# Patient Record
Sex: Female | Born: 1985 | Race: Black or African American | Hispanic: No | Marital: Single | Smoking: Former smoker
Health system: Southern US, Community
[De-identification: ages and names within clinical notes are randomized; demographics above are authoritative.]

## PROBLEM LIST (undated history)

## (undated) DIAGNOSIS — B009 Herpesviral infection, unspecified: Secondary | ICD-10-CM

## (undated) DIAGNOSIS — K219 Gastro-esophageal reflux disease without esophagitis: Secondary | ICD-10-CM

## (undated) DIAGNOSIS — N760 Acute vaginitis: Secondary | ICD-10-CM

## (undated) DIAGNOSIS — A599 Trichomoniasis, unspecified: Secondary | ICD-10-CM

## (undated) DIAGNOSIS — B9689 Other specified bacterial agents as the cause of diseases classified elsewhere: Secondary | ICD-10-CM

## (undated) DIAGNOSIS — A6000 Herpesviral infection of urogenital system, unspecified: Secondary | ICD-10-CM

## (undated) DIAGNOSIS — E785 Hyperlipidemia, unspecified: Secondary | ICD-10-CM

## (undated) DIAGNOSIS — A549 Gonococcal infection, unspecified: Secondary | ICD-10-CM

## (undated) DIAGNOSIS — D649 Anemia, unspecified: Secondary | ICD-10-CM

## (undated) DIAGNOSIS — E669 Obesity, unspecified: Secondary | ICD-10-CM

## (undated) DIAGNOSIS — K649 Unspecified hemorrhoids: Secondary | ICD-10-CM

## (undated) HISTORY — DX: Unspecified hemorrhoids: K64.9

## (undated) HISTORY — DX: Gonococcal infection, unspecified: A54.9

## (undated) HISTORY — DX: Obesity, unspecified: E66.9

## (undated) HISTORY — DX: Trichomoniasis, unspecified: A59.9

## (undated) HISTORY — DX: Other specified bacterial agents as the cause of diseases classified elsewhere: B96.89

## (undated) HISTORY — DX: Herpesviral infection of urogenital system, unspecified: A60.00

## (undated) HISTORY — DX: Hyperlipidemia, unspecified: E78.5

## (undated) HISTORY — DX: Herpesviral infection, unspecified: B00.9

## (undated) HISTORY — DX: Other specified bacterial agents as the cause of diseases classified elsewhere: N76.0

## (undated) HISTORY — DX: Gastro-esophageal reflux disease without esophagitis: K21.9

---
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
## 2008-05-07 ENCOUNTER — Observation Stay: Payer: Self-pay

## 2008-05-11 ENCOUNTER — Inpatient Hospital Stay: Payer: Self-pay | Admitting: Obstetrics and Gynecology

## 2010-04-19 ENCOUNTER — Emergency Department: Payer: Self-pay | Admitting: Internal Medicine

## 2010-07-24 ENCOUNTER — Emergency Department: Payer: Self-pay

## 2011-07-07 ENCOUNTER — Ambulatory Visit: Payer: Self-pay | Admitting: Internal Medicine

## 2011-07-22 ENCOUNTER — Emergency Department: Payer: Self-pay | Admitting: *Deleted

## 2011-07-22 LAB — URINALYSIS, COMPLETE
Bacteria: NONE SEEN
Leukocyte Esterase: NEGATIVE
Nitrite: NEGATIVE
Protein: NEGATIVE
RBC,UR: 8 /HPF (ref 0–5)
WBC UR: 2 /HPF (ref 0–5)

## 2011-07-22 LAB — CBC
HCT: 43.1 % (ref 35.0–47.0)
MCV: 84 fL (ref 80–100)
RDW: 13.8 % (ref 11.5–14.5)
WBC: 6.7 10*3/uL (ref 3.6–11.0)

## 2011-07-23 LAB — WET PREP, GENITAL

## 2012-07-25 ENCOUNTER — Emergency Department: Payer: Self-pay | Admitting: Emergency Medicine

## 2012-07-25 LAB — CBC
HCT: 35.8 % (ref 35.0–47.0)
MCH: 26.7 pg (ref 26.0–34.0)
MCHC: 33.2 g/dL (ref 32.0–36.0)
RBC: 4.46 10*6/uL (ref 3.80–5.20)
RDW: 14.7 % — ABNORMAL HIGH (ref 11.5–14.5)
WBC: 11.8 10*3/uL — ABNORMAL HIGH (ref 3.6–11.0)

## 2012-07-25 LAB — COMPREHENSIVE METABOLIC PANEL
Albumin: 3.6 g/dL (ref 3.4–5.0)
Alkaline Phosphatase: 100 U/L (ref 50–136)
BUN: 5 mg/dL — ABNORMAL LOW (ref 7–18)
Bilirubin,Total: 0.3 mg/dL (ref 0.2–1.0)
Calcium, Total: 9.7 mg/dL (ref 8.5–10.1)
Creatinine: 0.65 mg/dL (ref 0.60–1.30)
EGFR (African American): >60
EGFR (Non-African Amer.): >60
Glucose: 77 mg/dL (ref 65–99)
Potassium: 3.4 mmol/L — ABNORMAL LOW (ref 3.5–5.1)
Sodium: 136 mmol/L (ref 136–145)

## 2012-09-20 ENCOUNTER — Observation Stay: Payer: Self-pay

## 2012-09-20 LAB — URINALYSIS, COMPLETE
Bilirubin,UR: NEGATIVE
Blood: NEGATIVE
Glucose,UR: NEGATIVE mg/dL (ref 0–75)
Ph: 6 (ref 4.5–8.0)
Protein: NEGATIVE
RBC,UR: 2 /HPF (ref 0–5)
Specific Gravity: 1.01 (ref 1.003–1.030)
WBC UR: 1 /HPF (ref 0–5)

## 2013-01-05 ENCOUNTER — Inpatient Hospital Stay: Payer: Self-pay | Admitting: Obstetrics and Gynecology

## 2013-01-05 HISTORY — PX: OTHER SURGICAL HISTORY: SHX169

## 2013-01-05 LAB — CBC WITH DIFFERENTIAL/PLATELET
Basophil #: 0 10*3/uL (ref 0.0–0.1)
Eosinophil #: 0 10*3/uL (ref 0.0–0.7)
Eosinophil %: 0.4 %
HGB: 10.9 g/dL — ABNORMAL LOW (ref 12.0–16.0)
Lymphocyte #: 1.4 10*3/uL (ref 1.0–3.6)
Lymphocyte %: 15.2 %
MCHC: 32.8 g/dL (ref 32.0–36.0)
MCV: 79 fL — ABNORMAL LOW (ref 80–100)
Monocyte #: 0.7 x10 3/mm (ref 0.2–0.9)
Monocyte %: 7.7 %
Neutrophil #: 7.1 10*3/uL — ABNORMAL HIGH (ref 1.4–6.5)
Neutrophil %: 76.2 %
Platelet: 229 10*3/uL (ref 150–440)
RDW: 15 % — ABNORMAL HIGH (ref 11.5–14.5)
WBC: 9.3 10*3/uL (ref 3.6–11.0)

## 2013-01-07 LAB — HEMATOCRIT: HCT: 27.3 % — AB (ref 35.0–47.0)

## 2013-02-16 LAB — HM PAP SMEAR: HM Pap smear: NEGATIVE

## 2014-01-06 NOTE — L&D Delivery Note (Signed)
Delivery Note At 6:22 AM a viable female was delivered via Vaginal, Spontaneous Delivery - successful VBAC. (Presentation: Middle Occiput Anterior).  APGAR: 8, 9; weight 6 lb 14.6 oz (3135 g).   Placenta status: Intact, Spontaneous.  Cord: 3 vessels with the following complications: meconium.   Anesthesia: None  Episiotomy: None Lacerations:  Bilateral labial, no repair Est. Blood Loss (mL):  300cc  Precipitous delivery - was found to be 8cm at 5:10, delivered in ~1hr after AROM at 05:30. Short second stage.  Baby was not actively crying upon delivery, so cord was clamped and cut at the perineum and placed on the warmer.  Placenta was delivered intact via gentle traction and massage.  Baby was recovered and placed skin-to-skin on mom and we sang Happy [place] OuchBirthday.  Mom to postpartum.  Baby to Couplet care / Skin to Skin.  Governor Matos C 07/11/2014, 6:55 AM

## 2014-01-08 ENCOUNTER — Emergency Department: Payer: Self-pay | Admitting: Emergency Medicine

## 2014-01-08 LAB — URINALYSIS, COMPLETE
BACTERIA: NONE SEEN
BLOOD: NEGATIVE
Bilirubin,UR: NEGATIVE
Glucose,UR: NEGATIVE mg/dL (ref 0–75)
KETONE: NEGATIVE
Leukocyte Esterase: NEGATIVE
Nitrite: NEGATIVE
Ph: 7 (ref 4.5–8.0)
Protein: NEGATIVE
RBC,UR: 1 /HPF (ref 0–5)
SPECIFIC GRAVITY: 1.019 (ref 1.003–1.030)

## 2014-01-08 LAB — CBC WITH DIFFERENTIAL/PLATELET
BASOS PCT: 0.2 %
Basophil #: 0 10*3/uL (ref 0.0–0.1)
EOS ABS: 0 10*3/uL (ref 0.0–0.7)
Eosinophil %: 0.4 %
HCT: 36.9 % (ref 35.0–47.0)
HGB: 11.7 g/dL — ABNORMAL LOW (ref 12.0–16.0)
Lymphocyte #: 0.6 10*3/uL — ABNORMAL LOW (ref 1.0–3.6)
Lymphocyte %: 5.6 %
MCH: 25.8 pg — AB (ref 26.0–34.0)
MCHC: 31.7 g/dL — AB (ref 32.0–36.0)
MCV: 81 fL (ref 80–100)
MONO ABS: 0.8 x10 3/mm (ref 0.2–0.9)
Monocyte %: 7.6 %
Neutrophil #: 8.9 10*3/uL — ABNORMAL HIGH (ref 1.4–6.5)
Neutrophil %: 86.2 %
PLATELETS: 252 10*3/uL (ref 150–440)
RBC: 4.54 10*6/uL (ref 3.80–5.20)
RDW: 16.7 % — ABNORMAL HIGH (ref 11.5–14.5)
WBC: 10.4 10*3/uL (ref 3.6–11.0)

## 2014-01-08 LAB — BASIC METABOLIC PANEL
Anion Gap: 7 (ref 7–16)
BUN: 5 mg/dL — ABNORMAL LOW (ref 7–18)
CO2: 28 mmol/L (ref 21–32)
Calcium, Total: 8.6 mg/dL (ref 8.5–10.1)
Chloride: 104 mmol/L (ref 98–107)
Creatinine: 0.66 mg/dL (ref 0.60–1.30)
EGFR (Non-African Amer.): >60
GLUCOSE: 70 mg/dL (ref 65–99)
Osmolality: 273 (ref 275–301)
POTASSIUM: 3.6 mmol/L (ref 3.5–5.1)
SODIUM: 139 mmol/L (ref 136–145)

## 2014-01-08 LAB — TROPONIN I: Troponin-I: <0.02 ng/mL

## 2014-04-29 NOTE — Op Note (Signed)
PATIENT NAME:  Grace [name], Grace [name] MR#:  [id number] DATE OF BIRTH:  [birth year]  DATE OF PROCEDURE:  01/05/2013  PREOPERATIVE DIAGNOSES: 1.  Intrauterine pregnancy at 38 weeks, 5 days gestational age.  2.  Fetal intolerance of labor.  3.  Premature rupture of membranes.   POSTOPERATIVE DIAGNOSES:  1.  Intrauterine pregnancy at 38 weeks, 5 days gestational age.  2.  Fetal intolerance of labor.  3.  Premature rupture of membranes.   PROCEDURE: Primary low transverse cesarean section via Pfannenstiel incision.   ANESTHESIA: Spinal.   SURGEON: Thomasene Mohair, M.D.   ESTIMATED BLOOD LOSS: 750 mL.  OPERATIVE FLUIDS:  1300 mL crystalloid.   COMPLICATIONS: None.   FINDINGS: 1.  Viable female infant with Apgars of 9 at 1 minute and 9 at 5 minutes.  2.  A body wrapped umbilical cord.   SPECIMENS: None.   CONDITION: Stable at the of the procedure.   PROCEDURE IN DETAIL: The patient was taken to the Operating Room where spinal anesthesia was administered and found to be adequate. She was placed in the dorsal supine position with a leftward tilt and prepped and draped in the usual sterile fashion. After a timeout was called, a Pfannenstiel incision was made and carried through the various layers until the peritoneum was identified and entered sharply. After extension of the peritoneal opening, a bladder flap was created and a bladder retractor was placed to pull the bladder out of the operative area of interest. A scalpel was used to create the low transverse hysterotomy. This was extended using cranial and caudal tension. The fetal vertex was then grasped and elevated to the hysterotomy and with fundal pressure, the head followed by the body. Shoulders and the rest of the body were delivered without difficulty. The cord was clamped and cut and the infant was handed to the pediatrician. Cord blood was collected. Placenta was delivered spontaneously intact with a three vessel cord. The uterus was  then cleared of all clots and debris. The hysterotomy was closed using #0 Vicryl in a running locked fashion. A second layer of the same suture was used to obtain hemostasis. The uterus was returned to the abdomen and then the abdomen was cleared of all clots and debris. The peritoneum was reapproximated using #0 Vicryl in a running fashion.   The On-Q catheters were placed according to the manufacturer's recommendations. They were positioned approximately 4 cm cephalad to the incision line, approximately 1 cm apart, straddling the midline. They were inserted to a depth of approximately the fourth marking on the catheter and positioned just superficial to the rectus abdominis muscles and just deep to the rectus fascia. They were affixed to the skin using Dermabond, Steri-Strips and Tegaderm. Each catheter was bolused with 5 mL of 0.5% Marcaine plain after closure of the fascia.   Fascia was closed using #0 Maxon using 2 sutures; each starting at the lateral apices and meeting at the midline where they were tied together. The skin was closed using the subcuticular stapler. This incision was reinforced using benzoin and Steri-Strips.   The patient tolerated the procedure well. Sponge, lap, and needle counts were correct x 2. The patient was given Ancef 2 grams for antibiotic prophylaxis prior to skin incision. For VTE prophylaxis, the patient was wearing pneumatic compression stockings which were on and operating throughout the entire procedure. She was taken to the recovery area in stable condition.    ____________________________ Conard Novak, MD sdj:NTS D: 01/06/2013 00:08:39 ET T:  01/06/2013 00:41:28 ET JOB#: [id number]  cc: Conard NovakStephen D. Deloris Mittag, MD, <Dictator> Conard NovakSTEPHEN D Mallori Araque MD ELECTRONICALLY SIGNED 02/11/2013 16:52

## 2014-05-02 ENCOUNTER — Encounter
Admit: 2014-05-02 | Disposition: A | Discharge status: Home / Self Care | Payer: Self-pay | Attending: Pediatric Cardiology | Admitting: Pediatric Cardiology

## 2014-05-04 ENCOUNTER — Encounter
Admit: 2014-05-04 | Disposition: A | Discharge status: Home / Self Care | Payer: Self-pay | Attending: Obstetrics and Gynecology | Admitting: Obstetrics and Gynecology

## 2014-05-16 NOTE — H&P (Signed)
L&D Evaluation:  History Expanded:  HPI 29 yo G2P1001 at 577w5d gestational age by 8 week ultrasound. Pregnancy complicated by patient being a smoker. She had rupture of membranes at 11am today, clear fluid.  This was verified in the office today.  She presented to L&D after being seen in the office and ROM was again verified. She notes positive fetal movement, no leakage of fluid.  She has had a pink tinge to the amniotic fluid as it has continued to leak during the day today.  She had a TDaP on 11/19/12.   Blood Type (Maternal) O positive   Group B Strep Results Maternal (Result >5wks must be treated as unknown) negative   Maternal HIV Negative   Maternal Syphilis Ab Nonreactive   Maternal Varicella Immune   Rubella Results (Maternal) immune   Patient's Medical History No Chronic Illness   Patient's Surgical History none   Medications Pre Natal Vitamins   Allergies NKDA   Social History tobacco   ROS:  ROS All systems were reviewed.  HEENT, CNS, GI, GU, Respiratory, CV, Renal and Musculoskeletal systems were found to be normal., unless otherwise noted in HPI   Exam:  Vital Signs T98.2, BP 118/62, P70, RR 20   General no apparent distress   Mental Status clear   Chest clear   Heart normal sinus rhythm   Abdomen gravid, non-tender   Estimated Fetal Weight Average for gestational age   Fetal Position cephalic   Back no CVAT   Edema no edema   Pelvic no external lesions, 3cm per RN   Mebranes Ruptured, grossly   Description clear   FHT 140/mod var/+accels/deep variable decels to 70s, short duration with return to baseline/mod variability   Ucx 2 q 10 min   Impression:  Impression PROM   Plan:  Comments 1) Labor: placed IUPC and FSE, started amnioinfusion 2) Fetal well being: reassuring overall.  Will watch carefully and try to correct variable decels with amnioinfusion. Discussed concerns with patient. 3) GBS neg   Labs:  Lab Results:  Routine  Hem:  31-Dec-14 18:14   WBC (CBC) 9.3  Hemoglobin (CBC)  10.9  Hematocrit (CBC)  33.2  Platelet Count (CBC) 229  MCV  79  MCH  25.8  MCHC 32.8  RDW  15.0  Neutrophil % 76.2  Lymphocyte % 15.2  Monocyte % 7.7  Eosinophil % 0.4  Basophil % 0.5  Neutrophil #  7.1  Lymphocyte # 1.4  Monocyte # 0.7  Eosinophil # 0.0  Basophil # 0.0 (Result(s) reported on 05 Jan 2013 at 06:40PM.)   Electronic Signatures: Conard NovakJackson, Alayshia Marini D (MD)  (Signed 31-Dec-14 21:22)  Authored: L&D Evaluation, Labs   Last Updated: 31-Dec-14 21:22 by Conard NovakJackson, Semaj Kham D (MD)

## 2014-06-11 LAB — OB RESULTS CONSOLE GBS: GBS: NEGATIVE

## 2014-06-27 LAB — OB RESULTS CONSOLE GBS: GBS: NEGATIVE

## 2014-07-11 ENCOUNTER — Inpatient Hospital Stay
Admission: EM | Admit: 2014-07-11 | Discharge: 2014-07-12 | DRG: 775 | Disposition: A | Discharge status: Home / Self Care | Payer: Medicaid Other | Attending: Obstetrics & Gynecology | Admitting: Obstetrics & Gynecology

## 2014-07-11 DIAGNOSIS — F17210 Nicotine dependence, cigarettes, uncomplicated: Secondary | ICD-10-CM | POA: Diagnosis present

## 2014-07-11 DIAGNOSIS — F329 Major depressive disorder, single episode, unspecified: Secondary | ICD-10-CM | POA: Diagnosis present

## 2014-07-11 DIAGNOSIS — F1290 Cannabis use, unspecified, uncomplicated: Secondary | ICD-10-CM | POA: Diagnosis present

## 2014-07-11 DIAGNOSIS — N858 Other specified noninflammatory disorders of uterus: Secondary | ICD-10-CM | POA: Diagnosis present

## 2014-07-11 DIAGNOSIS — O99344 Other mental disorders complicating childbirth: Secondary | ICD-10-CM | POA: Diagnosis present

## 2014-07-11 DIAGNOSIS — O99324 Drug use complicating childbirth: Secondary | ICD-10-CM | POA: Diagnosis present

## 2014-07-11 DIAGNOSIS — O34219 Maternal care for unspecified type scar from previous cesarean delivery: Secondary | ICD-10-CM

## 2014-07-11 DIAGNOSIS — D649 Anemia, unspecified: Secondary | ICD-10-CM | POA: Diagnosis present

## 2014-07-11 DIAGNOSIS — O3421 Maternal care for scar from previous cesarean delivery: Principal | ICD-10-CM | POA: Diagnosis present

## 2014-07-11 DIAGNOSIS — Z3A38 38 weeks gestation of pregnancy: Secondary | ICD-10-CM | POA: Diagnosis present

## 2014-07-11 DIAGNOSIS — O9902 Anemia complicating childbirth: Secondary | ICD-10-CM | POA: Diagnosis present

## 2014-07-11 DIAGNOSIS — R42 Dizziness and giddiness: Secondary | ICD-10-CM | POA: Diagnosis present

## 2014-07-11 DIAGNOSIS — O99334 Smoking (tobacco) complicating childbirth: Secondary | ICD-10-CM | POA: Diagnosis present

## 2014-07-11 DIAGNOSIS — Z3490 Encounter for supervision of normal pregnancy, unspecified, unspecified trimester: Secondary | ICD-10-CM

## 2014-07-11 DIAGNOSIS — Z3493 Encounter for supervision of normal pregnancy, unspecified, third trimester: Secondary | ICD-10-CM

## 2014-07-11 DIAGNOSIS — O09293 Supervision of pregnancy with other poor reproductive or obstetric history, third trimester: Secondary | ICD-10-CM

## 2014-07-11 HISTORY — DX: Anemia, unspecified: D64.9

## 2014-07-11 LAB — TYPE AND SCREEN
ABO/RH(D): O POS
Antibody Screen: NEGATIVE

## 2014-07-11 LAB — CBC
HEMATOCRIT: 35.5 % (ref 35.0–47.0)
HEMOGLOBIN: 11.3 g/dL — AB (ref 12.0–16.0)
MCH: 25.9 pg — AB (ref 26.0–34.0)
MCHC: 31.9 g/dL — AB (ref 32.0–36.0)
MCV: 81.3 fL (ref 80.0–100.0)
Platelets: 236 10*3/uL (ref 150–440)
RBC: 4.36 MIL/uL (ref 3.80–5.20)
RDW: 16.2 % — ABNORMAL HIGH (ref 11.5–14.5)
WBC: 12.9 10*3/uL — ABNORMAL HIGH (ref 3.6–11.0)

## 2014-07-11 LAB — ABO/RH: ABO/RH(D): O POS

## 2014-07-11 MED ORDER — SENNOSIDES-DOCUSATE SODIUM 8.6-50 MG PO TABS: 2.0000 | ORAL_TABLET | ORAL | Status: DC

## 2014-07-11 MED ORDER — DIBUCAINE 1 % RE OINT: 1.0000 "application " | TOPICAL_OINTMENT | RECTAL | Status: DC | PRN

## 2014-07-11 MED ORDER — LACTATED RINGERS IV SOLN: INTRAVENOUS | Status: DC

## 2014-07-11 MED ORDER — ONDANSETRON HCL 4 MG PO TABS: 4.0000 mg | ORAL_TABLET | ORAL | Status: DC | PRN

## 2014-07-11 MED ORDER — OXYTOCIN 40 UNITS IN LACTATED RINGERS INFUSION - SIMPLE MED
INTRAVENOUS | Status: AC
  Filled 2014-07-11: qty 1000

## 2014-07-11 MED ORDER — DIPHENHYDRAMINE HCL 25 MG PO CAPS: 25.0000 mg | ORAL_CAPSULE | Freq: Four times a day (QID) | ORAL | Status: DC | PRN

## 2014-07-11 MED ORDER — LANOLIN HYDROUS EX OINT: TOPICAL_OINTMENT | CUTANEOUS | Status: DC | PRN

## 2014-07-11 MED ORDER — LACTATED RINGERS IV SOLN: 500.0000 mL | INTRAVENOUS | Status: DC | PRN

## 2014-07-11 MED ORDER — OXYCODONE-ACETAMINOPHEN 5-325 MG PO TABS: 1.0000 | ORAL_TABLET | ORAL | Status: DC | PRN

## 2014-07-11 MED ORDER — OXYTOCIN BOLUS FROM INFUSION
500.0000 mL | INTRAVENOUS | Status: DC
  Administered 2014-07-11: 500 mL via INTRAVENOUS

## 2014-07-11 MED ORDER — MISOPROSTOL 200 MCG PO TABS
ORAL_TABLET | ORAL | Status: AC
  Filled 2014-07-11: qty 4

## 2014-07-11 MED ORDER — ACETAMINOPHEN 325 MG PO TABS: 650.0000 mg | ORAL_TABLET | ORAL | Status: DC | PRN

## 2014-07-11 MED ORDER — WITCH HAZEL-GLYCERIN EX PADS: 1.0000 "application " | MEDICATED_PAD | CUTANEOUS | Status: DC | PRN

## 2014-07-11 MED ORDER — PRENATAL MULTIVITAMIN CH
1.0000 | ORAL_TABLET | Freq: Every day | ORAL | Status: DC
  Administered 2014-07-11: 1 via ORAL
  Filled 2014-07-11: qty 1

## 2014-07-11 MED ORDER — SIMETHICONE 80 MG PO CHEW: 80.0000 mg | CHEWABLE_TABLET | ORAL | Status: DC | PRN

## 2014-07-11 MED ORDER — ONDANSETRON HCL 4 MG/2ML IJ SOLN: 4.0000 mg | INTRAMUSCULAR | Status: DC | PRN

## 2014-07-11 MED ORDER — OXYCODONE-ACETAMINOPHEN 5-325 MG PO TABS: 2.0000 | ORAL_TABLET | ORAL | Status: DC | PRN

## 2014-07-11 MED ORDER — OXYTOCIN 40 UNITS IN LACTATED RINGERS INFUSION - SIMPLE MED: 62.5000 mL/h | INTRAVENOUS | Status: DC

## 2014-07-11 MED ORDER — LIDOCAINE HCL (PF) 1 % IJ SOLN
INTRAMUSCULAR | Status: AC
  Filled 2014-07-11: qty 30

## 2014-07-11 MED ORDER — IBUPROFEN 600 MG PO TABS
600.0000 mg | ORAL_TABLET | Freq: Four times a day (QID) | ORAL | Status: DC
  Administered 2014-07-11 – 2014-07-12 (×3): 600 mg via ORAL
  Filled 2014-07-11 (×3): qty 1

## 2014-07-11 MED ORDER — CITRIC ACID-SODIUM CITRATE 334-500 MG/5ML PO SOLN: 30.0000 mL | ORAL | Status: DC | PRN

## 2014-07-11 MED ORDER — AMMONIA AROMATIC IN INHA
RESPIRATORY_TRACT | Status: AC
  Filled 2014-07-11: qty 10

## 2014-07-11 MED ORDER — LIDOCAINE HCL (PF) 1 % IJ SOLN
30.0000 mL | INTRAMUSCULAR | Status: DC | PRN
  Filled 2014-07-11: qty 30

## 2014-07-11 MED ORDER — ONDANSETRON HCL 4 MG/2ML IJ SOLN: 4.0000 mg | Freq: Four times a day (QID) | INTRAMUSCULAR | Status: DC | PRN

## 2014-07-11 MED ORDER — OXYTOCIN 10 UNIT/ML IJ SOLN
INTRAMUSCULAR | Status: AC
Start: 2014-07-11 — End: 2014-07-11
  Filled 2014-07-11: qty 2

## 2014-07-11 NOTE — OB Triage Note (Signed)
Pt to L&D by EMS with c/o contractions starting at 0215. Denies ROM or VB.   No Intercourse this evening.

## 2014-07-11 NOTE — Discharge Summary (Signed)
Obstetrical Discharge Summary  Date of Admission: 07/11/2014 Date of Discharge: 07/12/2014  Primary OB: [facility]  Gestational Age at Delivery: 38.2  Antepartum complications:  1. History of C/S for fetal intolerance of labor, previous SVD, desires TOLAC. 2. Close interval pregnancy, last baby @ 12/14 3. 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 4. Marijuana use during pregnancy, +UDS 5/13 5. Smoker, 5 cigs/day 6. Concern for VSD on anatomy scan, fetal echo negative, recommended repeat echo post partum. 7. Dizzy spells - EKG normal, seen by cards, anemia  Reason for Admission: contractions/labor Date of Delivery: 07/11/14 Delivered By: Leeroy Bock Ward Delivery Type: vaginal birth after cesarean (VBAC) Intrapartum complications/course: Precipitous delivery - was found to be 8cm at 5:10, delivered in ~1hr after AROM at 05:30. Short second stage.  Baby was not actively crying upon delivery, so cord was clamped and cut at the perineum and placed on the warmer.  Placenta was delivered intact via gentle traction and massage.  Baby was recovered and placed skin-to-skin on mom and we sang Happy [place] Ouch. Anesthesia: none Placenta: spontaneous Laceration: labial bilateral -- no active bleeding, no repair Episiotomy: none Newborn Data: Live born female  Birth Weight: 6 lb 14.6 oz (3135 g) APGAR: 8, 9  Vitals:  Today's Vitals   07/12/14 0013 07/12/14 0412 07/12/14 0822 07/12/14 0828  BP: 110/53 94/53 102/53   Pulse: 58 54 60   Temp: 98.1 F (36.7 C) 98.7 F (37.1 C) 97.9 F (36.6 C)   TempSrc: Oral Oral Oral   Resp: 17 18    Height:      Weight:      SpO2: 100%  100%   PainSc:    8       Physical Exam  Constitutional: She is oriented to person, place, and time and well-developed, well-nourished, and in no distress.  Eyes: Pupils are equal, round, and reactive to light.  Cardiovascular: Normal rate and regular  rhythm.   Pulmonary/Chest: Effort normal and breath sounds normal.  Abdominal: Soft. Bowel sounds are normal.  Musculoskeletal: Normal range of motion.  Neurological: She is alert and oriented to person, place, and time.  Skin: Skin is warm and dry.  fundus firm.   HEMOGLOBIN  Date Value Ref Range Status  07/12/2014 9.7* 12.0 - 16.0 g/dL Final   HGB  Date Value Ref Range Status  01/08/2014 11.7* 12.0-16.0 g/dL Final   HCT  Date Value Ref Range Status  07/12/2014 30.7* 35.0 - 47.0 % Final  01/08/2014 36.9 35.0-47.0 % Final    Post partum course: PP course uncomplicated. Pt ready for discharge on PPD 1.  Postpartum Procedures: none Disposition: Home  Rh Immune globulin given: no Rubella vaccine given: no Tdap vaccine given in AP or PP setting: No, declined PP as well Flu vaccine given in AP or PP setting: unk  Contraception: plans pills    Prenatal Labs: Blood type/Rh O+  Antibody screen neg  Rubella immune  RPR NR  HBsAg neg  HIV neg  GC neg  Chlamydia neg  Genetic screening declined  1 hour GTT 94  3 hour GTT n/a  GBS neg  Varicella immune Declined Tdap        Plan:  Grace [name] was discharged to home in good condition. Follow-up appointment at [facility] with Dr Elesa Massed in 6 weeks   Discharge Medications:   Medication List    TAKE these medications  ibuprofen 600 MG tablet  Commonly known as:  ADVIL,MOTRIN  Take 1 tablet (600 mg total) by mouth every 6 (six) hours.        Grace [name],  Grace [name] 07/12/2014 [facility]

## 2014-07-11 NOTE — H&P (Signed)
OB History & Physical   History of Present Illness:  Chief Complaint:   HPI:  Dot BeenLatasha K Mayberry is a 29 y.o. G3P2000 female at 38.2 by LMP with EDC of 07/23/14 presents with regular contractions with increasing frequency and intensity.  She denies LOF VB and has +FM.  Denies CP SOB F/C N/V/D, leg pain.  Pregnancy issues: 1. History of C/S for fetal intolerance of labor, previous SVD, desires TOLAC. 2. Close interval pregnancy, last baby @ 12/14 3. 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 4. Marijuana use during pregnancy, +UDS 5/13 5. Smoker, 5 cigs/day 6. Concern for VSD on anatomy scan, fetal echo negative, recommended repeat echo post partum. 7. Dizzy spells - EKG normal, seen by cards, anemia   Maternal Medical History:   Past Medical History  Diagnosis Date  . Anemia     Past Surgical History  Procedure Laterality Date  . Cesearen secdtion N/A     No Known Allergies  Prior to Admission medications   Not on File     Prenatal care site: [facility]  Social History: She  reports that she has been smoking Cigarettes.  She has been smoking about 1.00 pack per day. She has never used smokeless tobacco. She reports that she uses illicit drugs (Marijuana). She reports that she does not drink alcohol.  Family History: family history is not on file.   Review of Systems: Negative x 10 systems reviewed except as noted in the HPI.     Physical Exam:  Vital Signs: Temp 98.3, BP 121/61, Sp100% Room air, Pulse 92 General: no acute distress.  HEENT: normocephalic, atraumatic Heart: regular rate & rhythm.  No murmurs/rubs/gallops Lungs: clear to auscultation bilaterally, normal respiratory effort between contractions Abdomen: soft, gravid, non-tender;  EFW: 7# Extremities: non-tender, symmetric, no edema bilaterally.  DTRs 2+ Neurologic: Alert & oriented x 3.    Pertinent Results:  Prenatal  Labs: Blood type/Rh O+  Antibody screen neg  Rubella immune  RPR NR  HBsAg neg  HIV neg  GC neg  Chlamydia neg  Genetic screening declined  1 hour GTT 94  3 hour GTT n/a  GBS neg  Varicella immune Declined Tdap  FHT: 140 mod +accels no decels TOCO: q3-4 mins SVE: 1/50/high on presentation, quickly progressed to 8/90/0, AROM'd for meconium SSE: not performed   Assessment:  Dot BeenLatasha K Morga is a 29 y.o. G3P2000 female at 6638.2 with labor, TOLAC  Plan:  1. [facility] notify [facility]est[facility] 2. CBC, T&S, Clrs, IVF 3. GBS neg  4. Consents obtained. 5. Prepare for precipitous delivery. 6. Notify SCN about meconium, recommended PP echo  ----- Ranae Plumberhelsea Joniqua Sidle, MD Attending Obstetrician and Gynecologist [facility]

## 2014-07-12 DIAGNOSIS — O34219 Maternal care for unspecified type scar from previous cesarean delivery: Secondary | ICD-10-CM

## 2014-07-12 LAB — CBC
HEMATOCRIT: 30.7 % — AB (ref 35.0–47.0)
Hemoglobin: 9.7 g/dL — ABNORMAL LOW (ref 12.0–16.0)
MCH: 25.7 pg — ABNORMAL LOW (ref 26.0–34.0)
MCHC: 31.7 g/dL — ABNORMAL LOW (ref 32.0–36.0)
MCV: 81.1 fL (ref 80.0–100.0)
Platelets: 181 10*3/uL (ref 150–440)
RBC: 3.78 MIL/uL — AB (ref 3.80–5.20)
RDW: 16.2 % — ABNORMAL HIGH (ref 11.5–14.5)
WBC: 13.7 10*3/uL — ABNORMAL HIGH (ref 3.6–11.0)

## 2014-07-12 LAB — RPR: RPR Ser Ql: NONREACTIVE

## 2014-07-12 MED ORDER — IBUPROFEN 600 MG PO TABS: 600.0000 mg | ORAL_TABLET | Freq: Four times a day (QID) | ORAL | Status: DC

## 2014-07-12 NOTE — Progress Notes (Signed)
pt discharged home with infant.  Discharge instructions, prescriptions and follow up appointment given to and reviewed with pt.  Pt verbalized understanding, all questions answered.  Escorted by auxiliary. 

## 2014-07-24 ENCOUNTER — Other Ambulatory Visit: Payer: Self-pay

## 2014-07-25 ENCOUNTER — Inpatient Hospital Stay: Admit: 2014-07-25 | Payer: Self-pay | Admitting: Obstetrics & Gynecology

## 2014-07-25 SURGERY — Surgical Case
Anesthesia: Choice

## 2016-04-07 ENCOUNTER — Other Ambulatory Visit: Payer: Self-pay | Admitting: Obstetrics and Gynecology

## 2016-04-07 ENCOUNTER — Telehealth: Payer: Self-pay

## 2016-04-07 MED ORDER — VALACYCLOVIR HCL 500 MG PO TABS: 500.0000 mg | ORAL_TABLET | Freq: Two times a day (BID) | ORAL | 0 refills | Status: AC

## 2016-04-07 NOTE — Telephone Encounter (Signed)
Pt requesting refill on valtrex . Last given by TKB in October 2017. Pt is currently experiencing an outbreak. Please advise if ok to refill and send to [facility] [address] Thank you. cb# [phone]

## 2016-04-07 NOTE — Telephone Encounter (Signed)
Rx eRxd. RN to notify pt

## 2016-04-08 NOTE — Telephone Encounter (Signed)
Left msg for pt with this info

## 2016-08-04 ENCOUNTER — Emergency Department
Admission: EM | Admit: 2016-08-04 | Discharge: 2016-08-04 | Disposition: A | Discharge status: Home / Self Care | Payer: Medicaid Other | Attending: Emergency Medicine | Admitting: Emergency Medicine

## 2016-08-04 DIAGNOSIS — N644 Mastodynia: Secondary | ICD-10-CM | POA: Diagnosis not present

## 2016-08-04 DIAGNOSIS — N6310 Unspecified lump in the right breast, unspecified quadrant: Secondary | ICD-10-CM | POA: Diagnosis present

## 2016-08-04 DIAGNOSIS — F17210 Nicotine dependence, cigarettes, uncomplicated: Secondary | ICD-10-CM | POA: Insufficient documentation

## 2016-08-04 LAB — POCT PREGNANCY, URINE: Preg Test, Ur: NEGATIVE

## 2016-08-04 MED ORDER — CEPHALEXIN 500 MG PO CAPS: 500.0000 mg | ORAL_CAPSULE | Freq: Three times a day (TID) | ORAL | 0 refills | Status: DC

## 2016-08-04 NOTE — ED Notes (Signed)
See triage note states she noticed some tenderness to right breast couple of days ago  Then felt a small lump to same breast last pm

## 2016-08-04 NOTE — ED Provider Notes (Signed)
[facility] Emergency Department Provider Note   ____________________________________________   First MD Initiated Contact with Patient 08/04/16 1104     (approximate)  I have reviewed the triage vital signs and the nursing notes.   HISTORY  Chief Complaint Breast Mass   HPI Grace [name] is a 31 y.o. female . Complaint of tender lump to her right breast that she noticed this morning. Patient denies any injury to the area. She denies any discharge or tenderness to the nipple. Patient [facility] for Tuesday morning but felt that she could not wait. She is unaware of any fever or chills. She states the area is slightly tender. She rates her pain as 5/10.   Past Medical History:  Diagnosis Date  . Anemia     Patient Active Problem List   Diagnosis Date Noted  . Vaginal delivery following previous caesarean section, delivered 07/12/2014  . Postpartum care following vaginal delivery 07/12/2014  . Pregnancy 07/11/2014  . Pregnant and not yet delivered in third trimester 07/11/2014  . Labor and delivery, indication for care 07/11/2014  . Labor and delivery indication for care or intervention 07/11/2014    Past Surgical History:  Procedure Laterality Date  . Cesearen Secdtion N/A     Prior to Admission medications   Medication Sig Start Date End Date Taking? Authorizing Provider  cephALEXin (KEFLEX) 500 MG capsule Take 1 capsule (500 mg total) by mouth 3 (three) times daily. 08/04/16   Tommi RumpsSummers, Akisha Sturgill L, PA-C    Allergies Patient has no known allergies.  No family history on file.  Social History Social History  Substance Use Topics  . Smoking status: Current Every Day Smoker    Packs/day: 1.00    Types: Cigarettes  . Smokeless tobacco: Never Used  . Alcohol use No    Review of Systems Constitutional: No fever/chills Cardiovascular: Denies chest pain. Respiratory: Denies shortness of  breath. Gastrointestinal:  No nausea, no vomiting.  Skin: Positive right breast pain. Neurological: Negative for headaches, focal weakness or numbness.   ____________________________________________   PHYSICAL EXAM:  VITAL SIGNS: ED Triage Vitals  Enc Vitals Group     BP 08/04/16 1006 (!) 100/57     Pulse Rate 08/04/16 1006 68     Resp 08/04/16 1006 18     Temp 08/04/16 1006 98.7 F (37.1 C)     Temp Source 08/04/16 1006 Oral     SpO2 08/04/16 1006 100 %     Weight 08/04/16 1006 147 lb (66.7 kg)     Height 08/04/16 1006 5\' 3"  (1.6 m)     Head Circumference --      Peak Flow --      Pain Score 08/04/16 0919 5     Pain Loc --      Pain Edu? --      Excl. in GC? --    Constitutional: Alert and oriented. Well appearing and in no acute distress. Eyes: Conjunctivae are normal.  Head: Atraumatic. Nose: No congestion/rhinnorhea. Mouth/Throat: Mucous membranes are moist.  Oropharynx non-erythematous. Neck: No stridor.   Cardiovascular: Normal rate, regular rhythm. Grossly normal heart sounds.  Good peripheral circulation. Respiratory: Normal respiratory effort.  No retractions. Lungs CTAB. Musculoskeletal: Moves upper and lower extremities without any difficulty. Normal gait was noted. Neurologic:  Normal speech and language. No gross focal neurologic deficits are appreciated.  Skin:  Skin is warm, dry and intact. Examination of the right breast there is no gross  deformity and no dimpling noted. There is no discharge from the nipple. There is some tenderness on palpation of the area along at approximately 3:00 position. Area is minimally warm. No erythema present. Psychiatric: Mood and affect are normal. Speech and behavior are normal.  ____________________________________________   LABS (all labs ordered are listed, but only abnormal results are displayed)  Labs Reviewed  POC URINE PREG, ED  POCT PREGNANCY, URINE     PROCEDURES  Procedure(s) performed:  None  Procedures  Critical Care performed: No  ____________________________________________   INITIAL IMPRESSION / ASSESSMENT AND PLAN / ED COURSE  Pertinent labs & imaging results that were available during my care of the patient were reviewed by me and considered in my medical decision making (see chart for details).  Patient was reassured that this is not suspicious for cancer. Patient was placed on Keflex 500 mg 3 times a day for 10 days. She is to begin using warm compresses to the area and [facility] for recheck in 10-12 days.   ___________________________________________   FINAL CLINICAL IMPRESSION(S) / ED DIAGNOSES  Final diagnoses:  Pain of right breast      NEW MEDICATIONS STARTED DURING THIS VISIT:  Discharge Medication List as of 08/04/2016 11:26 AM    START taking these medications   Details  cephALEXin (KEFLEX) 500 MG capsule Take 1 capsule (500 mg total) by mouth 3 (three) times daily., Starting Mon 08/04/2016, Print         Note:  This document was prepared using Dragon voice recognition software and may include unintentional dictation errors.    Tommi RumpsSummers, Ari Engelbrecht L, PA-C 08/04/16 1438    Schaevitz, Myra Rudeavid Matthew, MD 08/04/16 [phone]

## 2016-08-04 NOTE — ED Triage Notes (Signed)
Pt c/o tender lump to the left breast that she noticed today.

## 2016-08-04 NOTE — Discharge Instructions (Signed)
Begin taking Keflex 500 mg 3 times a day for 10 days. Begin using warm compresses to your right breast 3-4 times per day. [facility] after finishing the antibiotic for recheck of your right breast. You may also take Tylenol or ibuprofen as needed if needed for pain.

## 2016-08-05 ENCOUNTER — Ambulatory Visit: Payer: Medicaid Other | Admitting: Obstetrics and Gynecology

## 2016-08-18 ENCOUNTER — Telehealth: Payer: Self-pay

## 2016-08-18 NOTE — Telephone Encounter (Signed)
Pt calling to request refill on valtrex. Please advise if ok. Thank you!

## 2016-08-19 ENCOUNTER — Other Ambulatory Visit: Payer: Self-pay | Admitting: Obstetrics and Gynecology

## 2016-08-19 DIAGNOSIS — A6004 Herpesviral vulvovaginitis: Secondary | ICD-10-CM

## 2016-08-19 DIAGNOSIS — A6000 Herpesviral infection of urogenital system, unspecified: Secondary | ICD-10-CM | POA: Insufficient documentation

## 2016-08-19 MED ORDER — VALACYCLOVIR HCL 500 MG PO TABS: 500.0000 mg | ORAL_TABLET | Freq: Two times a day (BID) | ORAL | 0 refills | Status: AC

## 2016-08-19 NOTE — Telephone Encounter (Signed)
Rx RF eRxd. RN to notify pt.

## 2016-08-19 NOTE — Telephone Encounter (Signed)
Left msg for pt that rx sent in.  

## 2016-11-06 ENCOUNTER — Telehealth: Payer: Self-pay

## 2016-11-06 NOTE — Telephone Encounter (Signed)
PT calling stating she needs a refill on her Valtrex. Annual isn't due until 12/2016. Not on Epic medication list.

## 2016-11-07 ENCOUNTER — Other Ambulatory Visit: Payer: Self-pay | Admitting: Obstetrics and Gynecology

## 2016-11-07 MED ORDER — VALACYCLOVIR HCL 500 MG PO TABS: 500.0000 mg | ORAL_TABLET | Freq: Two times a day (BID) | ORAL | 2 refills | Status: AC

## 2016-11-07 NOTE — Telephone Encounter (Signed)
Has been sent.

## 2016-12-18 ENCOUNTER — Ambulatory Visit (INDEPENDENT_AMBULATORY_CARE_PROVIDER_SITE_OTHER): Payer: Medicaid Other | Admitting: Advanced Practice Midwife

## 2016-12-18 ENCOUNTER — Encounter: Payer: Self-pay | Admitting: Advanced Practice Midwife

## 2016-12-18 VITALS — BP 104/68 | HR 89 | Ht 64.0 in | Wt 170.0 lb

## 2016-12-18 DIAGNOSIS — Z0000 Encounter for general adult medical examination without abnormal findings: Secondary | ICD-10-CM | POA: Diagnosis not present

## 2016-12-18 DIAGNOSIS — A6004 Herpesviral vulvovaginitis: Secondary | ICD-10-CM

## 2016-12-18 DIAGNOSIS — Z01419 Encounter for gynecological examination (general) (routine) without abnormal findings: Secondary | ICD-10-CM | POA: Diagnosis not present

## 2016-12-18 MED ORDER — VALACYCLOVIR HCL 500 MG PO TABS: 500.0000 mg | ORAL_TABLET | Freq: Two times a day (BID) | ORAL | 11 refills | Status: DC

## 2016-12-18 NOTE — Progress Notes (Signed)
Patient ID: Grace [name], female   DOB: 1985-06-08, 31 y.o.   MRN: [id number]     Gynecology Annual Exam  PCP: Patient, No Pcp Per  Chief Complaint:  Chief Complaint  Patient presents with  . Gynecologic Exam    refill on Valtrex    History of Present Illness: Patient is a 31 y.o. G3P2003 presents for annual exam. The patient has no complaints today. She has questions regarding HSV and possibility of future vaginal birth. Discussion of prophylactic treatment and ok for vaginal birth if no current outbreak. She was infected by her current boyfriend and says she is still having outbreaks regularly. She knows that she is more likely to have an outbreak when she is under increased stress. She is working on decreasing the stress. She requests a refill of valtrex.   LMP: Patient's last menstrual period was 12/03/2016 (exact date). Average Interval: regular, 28 days Duration of flow: 6 days Heavy Menses: no Clots: no Intermenstrual Bleeding: no Postcoital Bleeding: no Dysmenorrhea: no  The patient is sexually active. She currently uses condoms for contraception. She denies dyspareunia.  The patient does perform self breast exams.  There is no notable family history of breast or ovarian cancer in her family.  The patient wears seatbelts: yes.   The patient has regular exercise: yes.  She admits to healthy diet and adequate h2o intake. She admits to smoking 3 or 4 Black and Milds per day. She is considering quitting smoking.   The patient denies current symptoms of depression.    Review of Systems: Review of Systems  Constitutional: Negative.   HENT: Negative.   Eyes: Negative.   Respiratory: Negative.   Cardiovascular: Negative.   Gastrointestinal: Negative.   Genitourinary: Negative.   Musculoskeletal: Negative.   Skin: Negative.   Neurological: Negative.   Endo/Heme/Allergies: Negative.   Psychiatric/Behavioral: Negative.     Past Medical History:  Past Medical  History:  Diagnosis Date  . Anemia     Past Surgical History:  Past Surgical History:  Procedure Laterality Date  . Cesearen Secdtion N/A 01/05/2013    Gynecologic History:  Patient's last menstrual period was 12/03/2016 (exact date). Contraception: condoms Last Pap: 1 year ago Results were: no abnormalities   Obstetric History: 523P2003  Family History:  Family History  Problem Relation Age of Onset  . Lung cancer Maternal Grandmother   . Lung cancer Maternal Grandfather     Social History:  Social History   Socioeconomic History  . Marital status: Single    Spouse name: Not on file  . Number of children: Not on file  . Years of education: Not on file  . Highest education level: Not on file  Social Needs  . Financial resource strain: Not on file  . Food insecurity - worry: Not on file  . Food insecurity - inability: Not on file  . Transportation needs - medical: Not on file  . Transportation needs - non-medical: Not on file  Occupational History  . Not on file  Tobacco Use  . Smoking status: Current Every Day Smoker    Packs/day: 1.00    Types: Cigarettes  . Smokeless tobacco: Never Used  Substance and Sexual Activity  . Alcohol use: No  . Drug use: Yes    Types: Marijuana  . Sexual activity: Yes  Other Topics Concern  . Not on file  Social History Narrative  . Not on file    Allergies:  No Known Allergies  Medications: Prior to  Admission medications   Medication Sig Start Date End Date Taking? Authorizing Provider  valACYclovir (VALTREX) 500 MG tablet Take 1 tablet (500 mg total) by mouth 2 (two) times daily. 12/18/16  Yes Tresea MallGledhill, Shoichi Mielke, CNM    Physical Exam Vitals: Blood pressure 104/68, pulse 89, height 5\' 4"  (1.626 m), weight 170 lb (77.1 kg), last menstrual period 12/03/2016  General: NAD HEENT: normocephalic, anicteric Thyroid: no enlargement, no palpable nodules Pulmonary: No increased work of breathing, CTAB Cardiovascular: RRR,  distal pulses 2+ Breast: Breast symmetrical, no tenderness, no palpable nodules or masses, no skin or nipple retraction present, no nipple discharge.  No axillary or supraclavicular lymphadenopathy. Abdomen: NABS, soft, non-tender, non-distended.  Umbilicus without lesions.  No hepatomegaly, splenomegaly or masses palpable. No evidence of hernia  Genitourinary:  External: Normal external female genitalia.  Normal urethral meatus, normal  Bartholin's and Skene's glands.    Vagina: Normal vaginal mucosa, no evidence of prolapse.    Cervix: Grossly normal in appearance, no bleeding, no CMT  Uterus: Non-enlarged, mobile, normal contour.    Adnexa: ovaries non-enlarged, no adnexal masses  Rectal: deferred  Lymphatic: no evidence of inguinal lymphadenopathy Extremities: no edema, erythema, or tenderness Neurologic: Grossly intact Psychiatric: mood appropriate, affect full   Assessment: 31 y.o. G3P2003 routine annual exam  Plan: Problem List Items Addressed This Visit      Genitourinary   Herpes genitalia   Relevant Medications   valACYclovir (VALTREX) 500 MG tablet    Other Visit Diagnoses    Well woman exam with routine gynecological exam    -  Primary      1) STI screening was offered and declined  2) ASCCP guidelines and rational discussed.  Patient opts for every 3 years screening interval  3) Contraception - Condoms  4) Routine healthcare maintenance including cholesterol, diabetes screening discussed Declines  5) Follow up 1 year for routine annual exam  Tresea MallJane Cybil Senegal, CNM

## 2017-02-13 ENCOUNTER — Telehealth: Payer: Self-pay

## 2017-02-13 NOTE — Telephone Encounter (Signed)
Pt calling for refill.  [phone].  Confirmed rf of valtrex to [facility] on [address]  Called pharm to confirm they had refills as chart shows.  They do and will get one ready for pt.  Left detailed msg for pt of such and she can call pharm in future when needs refills.

## 2017-02-18 ENCOUNTER — Other Ambulatory Visit: Payer: Self-pay

## 2017-02-18 ENCOUNTER — Emergency Department
Admission: EM | Admit: 2017-02-18 | Discharge: 2017-02-18 | Disposition: A | Discharge status: Home / Self Care | Payer: Medicaid Other | Attending: Emergency Medicine | Admitting: Emergency Medicine

## 2017-02-18 ENCOUNTER — Emergency Department: Payer: Medicaid Other

## 2017-02-18 DIAGNOSIS — F17210 Nicotine dependence, cigarettes, uncomplicated: Secondary | ICD-10-CM | POA: Insufficient documentation

## 2017-02-18 DIAGNOSIS — R0781 Pleurodynia: Secondary | ICD-10-CM

## 2017-02-18 DIAGNOSIS — R0789 Other chest pain: Secondary | ICD-10-CM | POA: Insufficient documentation

## 2017-02-18 LAB — CBC
HCT: 36.7 % (ref 35.0–47.0)
HEMOGLOBIN: 11.9 g/dL — AB (ref 12.0–16.0)
MCH: 26.6 pg (ref 26.0–34.0)
MCHC: 32.4 g/dL (ref 32.0–36.0)
MCV: 82.1 fL (ref 80.0–100.0)
PLATELETS: 308 10*3/uL (ref 150–440)
RBC: 4.47 MIL/uL (ref 3.80–5.20)
RDW: 15.3 % — ABNORMAL HIGH (ref 11.5–14.5)
WBC: 7.6 10*3/uL (ref 3.6–11.0)

## 2017-02-18 LAB — POCT PREGNANCY, URINE: Preg Test, Ur: NEGATIVE

## 2017-02-18 LAB — COMPREHENSIVE METABOLIC PANEL
ALK PHOS: 83 U/L (ref 38–126)
ALT: 16 U/L (ref 14–54)
AST: 16 U/L (ref 15–41)
Albumin: 3.8 g/dL (ref 3.5–5.0)
Anion gap: 6 (ref 5–15)
BUN: 9 mg/dL (ref 6–20)
CHLORIDE: 107 mmol/L (ref 101–111)
CO2: 29 mmol/L (ref 22–32)
Calcium: 10.1 mg/dL (ref 8.9–10.3)
Creatinine, Ser: 1.01 mg/dL — ABNORMAL HIGH (ref 0.44–1.00)
GFR calc Af Amer: >60 mL/min (ref >60)
GFR calc non Af Amer: >60 mL/min (ref >60)
Glucose, Bld: 79 mg/dL (ref 65–99)
Potassium: 4.4 mmol/L (ref 3.5–5.1)
Sodium: 142 mmol/L (ref 135–145)
Total Bilirubin: 0.4 mg/dL (ref 0.3–1.2)
Total Protein: 6.4 g/dL — ABNORMAL LOW (ref 6.5–8.1)

## 2017-02-18 LAB — URINALYSIS, COMPLETE (UACMP) WITH MICROSCOPIC
BILIRUBIN URINE: NEGATIVE
Glucose, UA: NEGATIVE mg/dL
Hgb urine dipstick: NEGATIVE
Ketones, ur: NEGATIVE mg/dL
Leukocytes, UA: NEGATIVE
Nitrite: NEGATIVE
Protein, ur: NEGATIVE mg/dL
SPECIFIC GRAVITY, URINE: 1.017 (ref 1.005–1.030)
pH: 7 (ref 5.0–8.0)

## 2017-02-18 LAB — LIPASE, BLOOD: Lipase: 26 U/L (ref 11–51)

## 2017-02-18 MED ORDER — IBUPROFEN 800 MG PO TABS: 800.0000 mg | ORAL_TABLET | Freq: Three times a day (TID) | ORAL | 0 refills | Status: DC | PRN

## 2017-02-18 MED ORDER — KETOROLAC TROMETHAMINE 30 MG/ML IJ SOLN
30.0000 mg | Freq: Once | INTRAMUSCULAR | Status: AC
  Administered 2017-02-18: 30 mg via INTRAMUSCULAR
  Filled 2017-02-18: qty 1

## 2017-02-18 NOTE — ED Triage Notes (Signed)
Pt c/o right lateral rib pain worse with deep breathing and movement. States "It feels like I pulled something". States she was seen at the urgent care yesterday for same sx and they treated her for a sinus infection. States she has had a cough. Pt is eating and drinking in triage.

## 2017-02-18 NOTE — ED Provider Notes (Signed)
[facility] Emergency Department Provider Note  ____________________________________________  Time seen: Approximately 1:35 PM  I have reviewed the triage vital signs and the nursing notes.   HISTORY  Chief Complaint Spasms    HPI Dot BeenLatasha K Aube is a 32 y.o. female that presents emergency department for evaluation of low right-sided rib pain and a mild unproductive cough for 2 days.  Pain is worse with deep breathing and with movement. She states that it feels like she pulled a muscle. It is worse when she presses on it. She has had sinus congestion for several days and was diagnosed with a sinus infection at urgent care 2 days ago.  She smokes a pack of cigarettes per day.  No SOB, nausea, vomiting, abdominal pain, back pain, dysuria, urgency, frequency.   Past Medical History:  Diagnosis Date  . Anemia     Patient Active Problem List   Diagnosis Date Noted  . Herpes genitalia 08/19/2016    Past Surgical History:  Procedure Laterality Date  . Cesearen Secdtion N/A 01/05/2013    Prior to Admission medications   Medication Sig Start Date End Date Taking? Authorizing Provider  ibuprofen (ADVIL,MOTRIN) 800 MG tablet Take 1 tablet (800 mg total) by mouth every 8 (eight) hours as needed. 02/18/17   Enid DerryWagner, Jemma Rasp, PA-C  valACYclovir (VALTREX) 500 MG tablet Take 1 tablet (500 mg total) by mouth 2 (two) times daily. 12/18/16   Tresea MallGledhill, Jane, CNM    Allergies Patient has no known allergies.  Family History  Problem Relation Age of Onset  . Lung cancer Maternal Grandmother   . Lung cancer Maternal Grandfather     Social History Social History   Tobacco Use  . Smoking status: Current Every Day Smoker    Packs/day: 1.00    Types: Cigarettes  . Smokeless tobacco: Never Used  Substance Use Topics  . Alcohol use: No  . Drug use: Yes    Types: Marijuana     Review of Systems  Constitutional: No fever/chills Eyes: No visual changes. No  discharge. ENT: Positive for congestion and rhinorrhea. Respiratory: Positive for cough. No SOB. Gastrointestinal: No abdominal pain.  No nausea, no vomiting.  No diarrhea.  No constipation. Skin: Negative for rash, abrasions, lacerations, ecchymosis. Neurological: Negative for headaches.   ____________________________________________   PHYSICAL EXAM:  VITAL SIGNS: ED Triage Vitals  Enc Vitals Group     BP 02/18/17 1115 112/63     Pulse Rate 02/18/17 1115 67     Resp 02/18/17 1115 16     Temp 02/18/17 1115 98.3 F (36.8 C)     Temp Source 02/18/17 1115 Oral     SpO2 02/18/17 1115 100 %     Weight 02/18/17 1116 170 lb (77.1 kg)     Height 02/18/17 1116 5\' 3"  (1.6 m)     Head Circumference --      Peak Flow --      Pain Score 02/18/17 1116 10     Pain Loc --      Pain Edu? --      Excl. in GC? --      Constitutional: Alert and oriented. Well appearing and in no acute distress. Eyes: Conjunctivae are normal. PERRL. EOMI. No discharge. Head: Atraumatic. ENT: Positive for maxillary sinus tenderness.      Ears: Tympanic membranes pearly gray with good landmarks. No discharge.      Nose: Mild congestion/rhinnorhea.      Mouth/Throat: Mucous membranes are moist.  Neck:  No stridor.   Hematological/Lymphatic/Immunilogical: No cervical lymphadenopathy. Cardiovascular: Normal rate, regular rhythm.  Good peripheral circulation. Respiratory: Normal respiratory effort without tachypnea or retractions. Lungs CTAB. Good air entry to the bases with no decreased or absent breath sounds. Gastrointestinal: Bowel sounds 4 quadrants. Soft and nontender to palpation. No guarding or rigidity. No palpable masses. No distention. Musculoskeletal: Full range of motion to all extremities. No gross deformities appreciated.  Tenderness to palpation over right lateral rib cage.  Pain elicited with thoracic rotation. Neurologic:  Normal speech and language. No gross focal neurologic deficits are  appreciated.    ____________________________________________   LABS (all labs ordered are listed, but only abnormal results are displayed)  Labs Reviewed  URINALYSIS, COMPLETE (UACMP) WITH MICROSCOPIC - Abnormal; Notable for the following components:      Result Value   Color, Urine YELLOW (*)    APPearance CLOUDY (*)    Bacteria, UA RARE (*)    Squamous Epithelial / LPF 6-30 (*)    All other components within normal limits  CBC - Abnormal; Notable for the following components:   Hemoglobin 11.9 (*)    RDW 15.3 (*)    All other components within normal limits  COMPREHENSIVE METABOLIC PANEL - Abnormal; Notable for the following components:   Creatinine, Ser 1.01 (*)    Total Protein 6.4 (*)    All other components within normal limits  LIPASE, BLOOD  POC URINE PREG, ED  POCT PREGNANCY, URINE   ____________________________________________  EKG   ____________________________________________  RADIOLOGY Lexine Baton, personally viewed and evaluated these images (plain radiographs) as part of my medical decision making, as well as reviewing the written report by the radiologist.  Dg Chest 2 View  Result Date: 02/18/2017 CLINICAL DATA:  Right lateral chest wall pain with a pleuritic component. Sensation of having "pulled something". Patient does report a cough. Current smoker. EXAM: CHEST  2 VIEW COMPARISON:  None in PACs FINDINGS: The lungs are well-expanded and clear. The heart and pulmonary vascularity are normal. The mediastinum is normal in width. There is gentle dextrocurvature centered at the thoracolumbar junction. IMPRESSION: There is no active cardiopulmonary disease. Electronically Signed   By: David  [place] M.D.   On: 02/18/2017 13:04    ____________________________________________    PROCEDURES  Procedure(s) performed:    Procedures    Medications  ketorolac (TORADOL) 30 MG/ML injection 30 mg (30 mg Intramuscular Given 02/18/17 1342)      ____________________________________________   INITIAL IMPRESSION / ASSESSMENT AND PLAN / ED COURSE  Pertinent labs & imaging results that were available during my care of the patient were reviewed by me and considered in my medical decision making (see chart for details).  Review of the [place] CSRS was performed in accordance of the [facility] prior to dispensing any controlled drugs.   Patient's diagnosis is consistent with rib pain. Vital signs, labwork, and exam are reassuring.  Chest x-ray negative for acute cardiopulmonary processes. Pain is likely musculoskeletal, as it is worse with palpation and thoracic rotation. Patient appears well and is staying well hydrated. Patient feels comfortable going home. Patient will be discharged home with prescriptions for ibuprofen. She will keep taking Augmentin for sinus infection. Patient is to follow up with PCP as needed or otherwise directed. Patient is given ED precautions to return to the ED for any worsening or new symptoms.   ____________________________________________  FINAL CLINICAL IMPRESSION(S) / ED DIAGNOSES  Final diagnoses:  Rib pain      NEW MEDICATIONS  STARTED DURING THIS VISIT:  ED Discharge Orders        Ordered    ibuprofen (ADVIL,MOTRIN) 800 MG tablet  Every 8 hours PRN     02/18/17 1340          This chart was dictated using voice recognition software/Dragon. Despite best efforts to proofread, errors can occur which can change the meaning. Any change was purely unintentional. No   Enid Derry, PA-C 02/18/17 1829    Jeanmarie Plant, MD 02/19/17 2224

## 2017-02-27 ENCOUNTER — Ambulatory Visit: Payer: Medicaid Other | Admitting: Obstetrics and Gynecology

## 2017-02-27 ENCOUNTER — Encounter: Payer: Self-pay | Admitting: Obstetrics and Gynecology

## 2017-02-27 VITALS — BP 108/72 | HR 76 | Ht 64.0 in | Wt 166.0 lb

## 2017-02-27 DIAGNOSIS — Z113 Encounter for screening for infections with a predominantly sexual mode of transmission: Secondary | ICD-10-CM | POA: Diagnosis not present

## 2017-02-27 DIAGNOSIS — N898 Other specified noninflammatory disorders of vagina: Secondary | ICD-10-CM | POA: Diagnosis not present

## 2017-02-27 DIAGNOSIS — N926 Irregular menstruation, unspecified: Secondary | ICD-10-CM | POA: Diagnosis not present

## 2017-02-27 LAB — POCT WET PREP WITH KOH
Clue Cells Wet Prep HPF POC: NEGATIVE
KOH PREP POC: NEGATIVE
Trichomonas, UA: NEGATIVE
YEAST WET PREP PER HPF POC: NEGATIVE

## 2017-02-27 LAB — POCT URINE PREGNANCY: Preg Test, Ur: NEGATIVE

## 2017-02-27 MED ORDER — FLUCONAZOLE 150 MG PO TABS: 150.0000 mg | ORAL_TABLET | Freq: Once | ORAL | 0 refills | Status: DC

## 2017-02-27 NOTE — Patient Instructions (Signed)
I value your feedback and entrusting us with your care. If you get a [facility] patient survey, I would appreciate you taking the time to let us know about your experience today. Thank you! 

## 2017-02-27 NOTE — Progress Notes (Signed)
Chief Complaint  Patient presents with  . Gynecologic Exam    STD Check New Partner    HPI:      Grace [name] is a 32 y.o. G3P2003 who LMP was Patient's last menstrual period was 01/28/2017., presents today for STD testing due to a new partner. Not using condoms/any BC. No known exposures. Having some vaginal dryness during sex the past few days, but no increased d/c, irritation, odor. Doesn't usually have to use lubricants. Has been on abx for sinus infection this wk. Late for menses and wants UPT.   Past Medical History:  Diagnosis Date  . Anemia   . BV (bacterial vaginosis)   . Genital herpes   . Gonorrhea   . Trichomoniasis     Past Surgical History:  Procedure Laterality Date  . Cesearen Secdtion N/A 01/05/2013    Family History  Problem Relation Age of Onset  . Lung cancer Maternal Grandmother   . Lung cancer Maternal Grandfather     Social History   Socioeconomic History  . Marital status: Single    Spouse name: Not on file  . Number of children: Not on file  . Years of education: Not on file  . Highest education level: Not on file  Social Needs  . Financial resource strain: Not on file  . Food insecurity - worry: Not on file  . Food insecurity - inability: Not on file  . Transportation needs - medical: Not on file  . Transportation needs - non-medical: Not on file  Occupational History  . Not on file  Tobacco Use  . Smoking status: Current Every Day Smoker    Packs/day: 1.00    Types: Cigarettes  . Smokeless tobacco: Never Used  Substance and Sexual Activity  . Alcohol use: No  . Drug use: Yes    Types: Marijuana  . Sexual activity: Yes  Other Topics Concern  . Not on file  Social History Narrative  . Not on file     Current Outpatient Medications:  .  benzonatate (TESSALON) 100 MG capsule, take 1 capsule by mouth every 8 hours if needed, Disp: , Rfl: 0 .  fluticasone (FLONASE) 50 MCG/ACT nasal spray, instill 2 sprays into each  nostril once daily, Disp: , Rfl: 0 .  ibuprofen (ADVIL,MOTRIN) 800 MG tablet, Take 1 tablet (800 mg total) by mouth every 8 (eight) hours as needed., Disp: 30 tablet, Rfl: 0 .  valACYclovir (VALTREX) 500 MG tablet, Take 1 tablet (500 mg total) by mouth 2 (two) times daily., Disp: 20 tablet, Rfl: 11 .  fluconazole (DIFLUCAN) 150 MG tablet, Take 1 tablet (150 mg total) by mouth once for 1 dose., Disp: 1 tablet, Rfl: 0   ROS:  Review of Systems  Constitutional: Negative for fever.  Gastrointestinal: Negative for blood in stool, constipation, diarrhea, nausea and vomiting.  Genitourinary: Positive for dyspareunia. Negative for dysuria, flank pain, frequency, hematuria, urgency, vaginal bleeding, vaginal discharge and vaginal pain.  Musculoskeletal: Negative for back pain.  Skin: Negative for rash.     OBJECTIVE:   Vitals:  BP 108/72 (BP Location: Left Arm, Patient Position: Sitting, Cuff Size: Normal)   [name] 78   Ht 5\' 4"  (1.626 m)   Wt 166 lb (75.3 kg)   LMP 01/28/2017   BMI 28.49 kg/m   Physical Exam  Constitutional: She is oriented to person, place, and time and well-developed, well-nourished, and in no distress. Vital signs are normal.  Genitourinary: Uterus normal, cervix normal,  right adnexa normal and left adnexa normal. Uterus is not enlarged. Cervix exhibits no motion tenderness and no tenderness. Right adnexum displays no mass and no tenderness. Left adnexum displays no mass and no tenderness. Vulva exhibits erythema. Vulva exhibits no exudate, no lesion, no rash and no tenderness. Vagina exhibits no lesion. No vaginal discharge found.  Neurological: She is oriented to person, place, and time.  Vitals reviewed.   Results: Results for orders placed or performed in visit on 02/27/17 (from the past 24 hour(s))  POCT Wet Prep with KOH     Status: Normal   Collection Time: 02/27/17  9:55 AM  Result Value Ref Range   Trichomonas, UA Negative    Clue Cells Wet Prep HPF POC  neg    Epithelial Wet Prep HPF POC  Few, Moderate, Many, Too numerous to count   Yeast Wet Prep HPF POC neg    Bacteria Wet Prep HPF POC  Few   RBC Wet Prep HPF POC     WBC Wet Prep HPF POC     KOH Prep POC Negative Negative  POCT urine pregnancy     Status: Normal   Collection Time: 02/27/17  9:55 AM  Result Value Ref Range   Preg Test, Ur Negative Negative     Assessment/Plan: Vaginal dryness - Neg wet prep/pos exam. Recent abx use. Rx diflucan. F/u prn.  - Plan: fluconazole (DIFLUCAN) 150 MG tablet, POCT Wet Prep with KOH  Screening for STD (sexually transmitted disease) - Will notify pt of results.  - Plan: HIV antibody, RPR, Hepatitis C antibody, Chlamydia/Gonococcus/Trichomonas, NAA  Late menses - Neg UPT. Rechk in a few days if no menses. Condoms.  - Plan: POCT urine pregnancy    Meds ordered this encounter  Medications  . fluconazole (DIFLUCAN) 150 MG tablet    Sig: Take 1 tablet (150 mg total) by mouth once for 1 dose.    Dispense:  1 tablet    Refill:  0    Order Specific Question:   Supervising Provider    Answer:   Nadara Mustard [[id number]]      Return if symptoms worsen or fail to improve.  Cherene Dobbins B. Shazia Mitchener, PA-C 02/27/2017 9:55 AM

## 2017-02-28 LAB — RPR: RPR: NONREACTIVE

## 2017-02-28 LAB — HIV ANTIBODY (ROUTINE TESTING W REFLEX): HIV Screen 4th Generation wRfx: NONREACTIVE

## 2017-02-28 LAB — HEPATITIS C ANTIBODY: Hep C Virus Ab: 0.1 s/co ratio (ref 0.0–0.9)

## 2017-03-03 ENCOUNTER — Telehealth: Payer: Self-pay | Admitting: Obstetrics and Gynecology

## 2017-03-03 DIAGNOSIS — A749 Chlamydial infection, unspecified: Secondary | ICD-10-CM | POA: Insufficient documentation

## 2017-03-03 LAB — CHLAMYDIA/GONOCOCCUS/TRICHOMONAS, NAA
Chlamydia by NAA: POSITIVE — AB
Gonococcus by NAA: NEGATIVE
TRICH VAG BY NAA: NEGATIVE

## 2017-03-03 MED ORDER — DOXYCYCLINE HYCLATE 100 MG PO CAPS: 100.0000 mg | ORAL_CAPSULE | Freq: Two times a day (BID) | ORAL | 0 refills | Status: DC

## 2017-03-03 NOTE — Telephone Encounter (Signed)
Pt aware of pos chlamydia, other neg STD labs. Rx doxy. RTO in 5 wks for TOC. Partner needs tx. RN to notify [facility].

## 2017-03-04 ENCOUNTER — Other Ambulatory Visit: Payer: Self-pay | Admitting: Obstetrics and Gynecology

## 2017-03-04 MED ORDER — AZITHROMYCIN 500 MG PO TABS: 1000.0000 mg | ORAL_TABLET | Freq: Once | ORAL | 0 refills | Status: AC

## 2017-03-04 NOTE — Telephone Encounter (Signed)
Patient is following to speak with Helmut Musterlicia about her medication. Please advise

## 2017-03-04 NOTE — Progress Notes (Signed)
Rx change due to n/d with doxy.

## 2017-03-04 NOTE — Telephone Encounter (Signed)
Pt having GI sx with doxy. Change to azithro. Rx eRxd. F/u prn.

## 2017-03-04 NOTE — Telephone Encounter (Signed)
Pt prescribed doxycycline yesterday and c/o it is making her sick and stomach uncomfortable. Pt would like to try something else. Please advise. Cb# [phone]

## 2017-03-12 ENCOUNTER — Encounter: Payer: Medicaid Other | Admitting: Obstetrics and Gynecology

## 2017-03-12 NOTE — Progress Notes (Signed)
This encounter was created in error - please disregard.

## 2017-12-23 ENCOUNTER — Ambulatory Visit: Payer: Medicaid Other | Admitting: Advanced Practice Midwife

## 2017-12-25 ENCOUNTER — Ambulatory Visit (INDEPENDENT_AMBULATORY_CARE_PROVIDER_SITE_OTHER): Payer: Medicaid Other | Admitting: Maternal Newborn

## 2017-12-25 ENCOUNTER — Encounter: Payer: Self-pay | Admitting: Maternal Newborn

## 2017-12-25 ENCOUNTER — Other Ambulatory Visit (HOSPITAL_COMMUNITY)
Admission: RE | Admit: 2017-12-25 | Discharge: 2017-12-25 | Disposition: A | Discharge status: Home / Self Care | Payer: Medicaid Other | Source: Ambulatory Visit | Attending: Maternal Newborn | Admitting: Maternal Newborn

## 2017-12-25 VITALS — BP 94/64 | HR 72 | Ht 63.0 in | Wt 177.0 lb

## 2017-12-25 DIAGNOSIS — Z124 Encounter for screening for malignant neoplasm of cervix: Secondary | ICD-10-CM

## 2017-12-25 DIAGNOSIS — Z0000 Encounter for general adult medical examination without abnormal findings: Secondary | ICD-10-CM | POA: Diagnosis not present

## 2017-12-25 DIAGNOSIS — Z01419 Encounter for gynecological examination (general) (routine) without abnormal findings: Secondary | ICD-10-CM

## 2017-12-25 NOTE — Progress Notes (Signed)
Gynecology Annual Exam  PCP: Patient, No Pcp Per  Chief Complaint:  Chief Complaint  Patient presents with  . Gynecologic Exam    History of Present Illness: Patient is a 32 y.o. G3P2003 presents for annual exam. The patient has no complaints today.   LMP: Patient's last menstrual period was 12/16/2017 (exact date). Average Interval: Monthly Duration of flow: 5 days Heavy Menses: no Intermenstrual Bleeding: no Postcoital Bleeding: no Dysmenorrhea: no  The patient is sexually active. She currently uses none for contraception. She denies dyspareunia.  The patient does perform self breast exams.  There is no notable family history of breast or ovarian cancer in her family.  The patient wears seatbelts: yes.   The patient has regular exercise: no.    The patient does not have current symptoms of depression.    Review of Systems  Constitutional: Negative.   HENT: Negative.   Eyes: Negative.   Respiratory: Negative for shortness of breath and wheezing.   Cardiovascular: Negative for chest pain and palpitations.  Gastrointestinal: Negative for abdominal pain.  Genitourinary: Negative.   Musculoskeletal: Negative.   Skin: Negative.   Neurological: Negative.   Endo/Heme/Allergies: Negative.   Psychiatric/Behavioral: Negative.   All other systems reviewed and are negative.   Past Medical History:  Past Medical History:  Diagnosis Date  . Anemia   . BV (bacterial vaginosis)   . Genital herpes   . Gonorrhea   . Trichomoniasis     Past Surgical History:  Past Surgical History:  Procedure Laterality Date  . Cesearen Secdtion N/A 01/05/2013    Gynecologic History:  Patient's last menstrual period was 12/16/2017 (exact date). Contraception: none Last Pap: 02/16/2013. Results were: NILM   Obstetric History: G3P2003  Family History:  Family History  Problem Relation Age of Onset  . Lung cancer Maternal Grandmother   . Lung cancer Maternal Grandfather      Social History:  Social History   Socioeconomic History  . Marital status: Single    Spouse name: Not on file  . Number of children: Not on file  . Years of education: Not on file  . Highest education level: Not on file  Occupational History  . Not on file  Social Needs  . Financial resource strain: Not on file  . Food insecurity:    Worry: Not on file    Inability: Not on file  . Transportation needs:    Medical: Not on file    Non-medical: Not on file  Tobacco Use  . Smoking status: Current Every Day Smoker    Packs/day: 1.00    Types: Cigarettes  . Smokeless tobacco: Never Used  Substance and Sexual Activity  . Alcohol use: Yes    Comment: occ  . Drug use: Yes    Types: Marijuana  . Sexual activity: Yes    Birth control/protection: None  Lifestyle  . Physical activity:    Days per week: 0 days    Minutes per session: 0 min  . Stress: Not at all  Relationships  . Social connections:    Talks on phone: Three times a week    Gets together: More than three times a week    Attends religious service: Never    Active member of club or organization: No    Attends meetings of clubs or organizations: Never    Relationship status: Never married  . Intimate partner violence:    Fear of current or ex partner: No    Emotionally  abused: No    Physically abused: No    Forced sexual activity: No  Other Topics Concern  . Not on file  Social History Narrative  . Not on file    Allergies:  Allergies  Allergen Reactions  . Doxycycline     Nausea, stomach pain, diarrhea    Medications: Prior to Admission medications   Medication Sig Start Date End Date Taking? Authorizing Provider  valACYclovir (VALTREX) 500 MG tablet Take 1 tablet (500 mg total) by mouth 2 (two) times daily. 12/18/16  Yes Tresea MallGledhill, Jane, CNM  fluticasone Aleda Grana(FLONASE) 50 MCG/ACT nasal spray instill 2 sprays into each nostril once daily 02/17/17   [provider]    Physical Exam Vitals:  Blood pressure 94/64, pulse 72, height 5\' 3"  (1.6 m), weight 177 lb (80.3 kg), last menstrual period 12/16/2017.  General: NAD HEENT: normocephalic, anicteric Thyroid: no enlargement, no palpable nodules Pulmonary: No increased work of breathing, CTAB Cardiovascular: RRR, no murmurs, rubs or gallops Breast: Breasts symmetrical, no tenderness, no palpable nodules or masses, no skin or nipple retraction present, no nipple discharge.  No axillary or supraclavicular lymphadenopathy. Abdomen: Soft, non-tender, non-distended.  Umbilicus without lesions.  No hepatomegaly, splenomegaly or masses palpable. No evidence of hernia  Genitourinary:  External: Normal external female genitalia.  Normal urethral  meatus, normal Bartholin's and Skene's glands.    Vagina: Normal vaginal mucosa, no evidence of prolapse.    Cervix: Grossly normal in appearance, no bleeding  Uterus: Non-enlarged, mobile, normal contour.  No CMT  Adnexa: ovaries non-enlarged, no adnexal masses  Rectal: deferred  Lymphatic: no evidence of inguinal lymphadenopathy Extremities: no edema, erythema, or tenderness Neurologic: Grossly intact Psychiatric: mood appropriate, affect full  Assessment: 32 y.o. [id number] annual exam  Plan: Problem List Items Addressed This Visit    None    Visit Diagnoses    Women's annual routine gynecological examination    -  Primary   Pap smear for cervical cancer screening       Relevant Orders   Cytology - PAP      1) STI screening was offered and declined.  2) ASCCP guidelines and rationale discussed.  Patient opts for every 3 year screening interval.  3) Contraception - Trying to conceive, has been having unprotected intercourse with current partner for about 3 years. She has conceived with a previous partner. Discussed timing of intercourse for conception. Also suggested that partner may want to undergo fertility testing before she begins an infertility workup.  4) Routine healthcare  maintenance including cholesterol, diabetes screening: Declines  5) Follow up 1 year for an annual exam.  Marcelyn BruinsJacelyn Schmid, CNM 12/25/2017

## 2017-12-28 ENCOUNTER — Encounter: Payer: Self-pay | Admitting: Maternal Newborn

## 2017-12-28 LAB — CYTOLOGY - PAP
Diagnosis: NEGATIVE
HPV: NOT DETECTED

## 2018-03-23 ENCOUNTER — Ambulatory Visit: Payer: Medicaid Other | Admitting: Obstetrics and Gynecology

## 2018-03-23 ENCOUNTER — Other Ambulatory Visit: Payer: Self-pay

## 2018-03-23 ENCOUNTER — Encounter: Payer: Self-pay | Admitting: Obstetrics and Gynecology

## 2018-03-23 VITALS — BP 108/70 | HR 74 | Ht 63.0 in | Wt 182.0 lb

## 2018-03-23 DIAGNOSIS — B9689 Other specified bacterial agents as the cause of diseases classified elsewhere: Secondary | ICD-10-CM | POA: Diagnosis not present

## 2018-03-23 DIAGNOSIS — N760 Acute vaginitis: Secondary | ICD-10-CM

## 2018-03-23 LAB — POCT WET PREP WITH KOH
CLUE CELLS WET PREP PER HPF POC: POSITIVE
KOH Prep POC: POSITIVE — AB
Trichomonas, UA: NEGATIVE
Yeast Wet Prep HPF POC: NEGATIVE

## 2018-03-23 MED ORDER — METRONIDAZOLE 0.75 % VA GEL: 1.0000 | Freq: Every day | VAGINAL | 0 refills | Status: AC

## 2018-03-23 NOTE — Patient Instructions (Signed)
I value your feedback and entrusting us with your care. If you get a [facility] patient survey, I would appreciate you taking the time to let us know about your experience today. Thank you! 

## 2018-03-23 NOTE — Progress Notes (Signed)
Patient, No Pcp Per   Chief Complaint  Patient presents with  . Vaginal odor    not much itchiness, odor is noticed during intercourse only, no vaginal irritation x 1 week    HPI:      Ms. JERICHO CADDICK is a 33 y.o. G3P2003 who LMP was Patient's last menstrual period was 03/09/2018 (exact date)., presents today for increased d/c with odor, no irritation for about a 1 wk. Sx worse with sex. No meds used to tx. No recent abx use. Hx of BV in past. No LBP, belly pain, fevers. No new sex partners.    Past Medical History:  Diagnosis Date  . Anemia   . BV (bacterial vaginosis)   . Genital herpes   . Gonorrhea   . Trichomoniasis     Past Surgical History:  Procedure Laterality Date  . Cesearen Secdtion N/A 01/05/2013    Family History  Problem Relation Age of Onset  . Lung cancer Maternal Grandmother   . Lung cancer Maternal Grandfather     Social History   Socioeconomic History  . Marital status: Single    Spouse name: Not on file  . Number of children: Not on file  . Years of education: Not on file  . Highest education level: Not on file  Occupational History  . Not on file  Social Needs  . Financial resource strain: Not on file  . Food insecurity:    Worry: Not on file    Inability: Not on file  . Transportation needs:    Medical: Not on file    Non-medical: Not on file  Tobacco Use  . Smoking status: Current Every Day Smoker    Packs/day: 1.00    Types: Cigarettes  . Smokeless tobacco: Never Used  Substance and Sexual Activity  . Alcohol use: Yes    Comment: occ  . Drug use: Yes    Types: Marijuana  . Sexual activity: Yes    Birth control/protection: None  Lifestyle  . Physical activity:    Days per week: 0 days    Minutes per session: 0 min  . Stress: Not at all  Relationships  . Social connections:    Talks on phone: Three times a week    Gets together: More than three times a week    Attends religious service: Never    Active member  of club or organization: No    Attends meetings of clubs or organizations: Never    Relationship status: Never married  . Intimate partner violence:    Fear of current or ex partner: No    Emotionally abused: No    Physically abused: No    Forced sexual activity: No  Other Topics Concern  . Not on file  Social History Narrative  . Not on file    Outpatient Medications Prior to Visit  Medication Sig Dispense Refill  . fluticasone (FLONASE) 50 MCG/ACT nasal spray instill 2 sprays into each nostril once daily  0  . valACYclovir (VALTREX) 500 MG tablet Take 1 tablet (500 mg total) by mouth 2 (two) times daily. 20 tablet 11   No facility-administered medications prior to visit.       ROS:  Review of Systems  Constitutional: Negative for fever.  Gastrointestinal: Negative for blood in stool, constipation, diarrhea, nausea and vomiting.  Genitourinary: Positive for vaginal discharge. Negative for dyspareunia, dysuria, flank pain, frequency, hematuria, urgency, vaginal bleeding and vaginal pain.  Musculoskeletal: Negative for back pain.  Skin: Negative for rash.    OBJECTIVE:   Vitals:  BP 108/70   Pulse 74   Ht 5\' 3"  (1.6 m)   Wt 182 lb (82.6 kg)   LMP 03/09/2018 (Exact Date)   BMI 32.24 kg/m   Physical Exam Vitals signs reviewed.  Constitutional:      Appearance: She is well-developed.  Pulmonary:     Effort: Pulmonary effort is normal.  Genitourinary:    General: Normal vulva.     Pubic Area: No rash.      Labia:        Right: No rash, tenderness or lesion.        Left: No rash, tenderness or lesion.      Vagina: Vaginal discharge present. No erythema or tenderness.     Cervix: Normal.     Uterus: Normal. Not enlarged and not tender.      Adnexa: Right adnexa normal and left adnexa normal.       Right: No mass or tenderness.         Left: No mass or tenderness.    Musculoskeletal: Normal range of motion.  Neurological:     Mental Status: She is alert and  oriented to person, place, and time.  Psychiatric:        Mood and Affect: Mood normal.        Behavior: Behavior normal.        Thought Content: Thought content normal.        Judgment: Judgment normal.     Results: Results for orders placed or performed in visit on 03/23/18 (from the past 24 hour(s))  POCT Wet Prep with KOH     Status: Abnormal   Collection Time: 03/23/18 10:20 AM  Result Value Ref Range   Trichomonas, UA Negative    Clue Cells Wet Prep HPF POC pos    Epithelial Wet Prep HPF POC     Yeast Wet Prep HPF POC neg    Bacteria Wet Prep HPF POC     RBC Wet Prep HPF POC     WBC Wet Prep HPF POC     KOH Prep POC Positive (A) Negative     Assessment/Plan: Bacterial vaginosis - Pos wet prep/sx. Rx metrogel. Will RF if sx recur. F/u pnr.  - Plan: metroNIDAZOLE (METROGEL) 0.75 % vaginal gel, POCT Wet Prep with KOH    Meds ordered this encounter  Medications  . metroNIDAZOLE (METROGEL) 0.75 % vaginal gel    Sig: Place 1 Applicatorful vaginally at bedtime for 5 days.    Dispense:  50 g    Refill:  0    Order Specific Question:   Supervising Provider    Answer:   Nadara Mustard [[id number]]      Return if symptoms worsen or fail to improve.  Alicia B. Copland, PA-C 03/23/2018 10:21 AM

## 2018-04-17 ENCOUNTER — Encounter: Payer: Self-pay | Admitting: Obstetrics and Gynecology

## 2018-04-20 ENCOUNTER — Other Ambulatory Visit: Payer: Self-pay | Admitting: Obstetrics and Gynecology

## 2018-04-20 DIAGNOSIS — N898 Other specified noninflammatory disorders of vagina: Secondary | ICD-10-CM

## 2018-04-20 MED ORDER — FLUCONAZOLE 150 MG PO TABS: 150.0000 mg | ORAL_TABLET | Freq: Once | ORAL | 0 refills | Status: DC

## 2018-04-20 NOTE — Progress Notes (Signed)
Rx diflucan after BV tx for itch.

## 2018-05-17 ENCOUNTER — Encounter: Payer: Self-pay | Admitting: Adult Health

## 2018-05-17 ENCOUNTER — Ambulatory Visit: Payer: Medicaid Other | Admitting: Adult Health

## 2018-05-17 ENCOUNTER — Other Ambulatory Visit: Payer: Self-pay

## 2018-05-17 VITALS — BP 96/72 | HR 70 | Resp 16 | Ht 63.0 in | Wt 179.0 lb

## 2018-05-17 DIAGNOSIS — K219 Gastro-esophageal reflux disease without esophagitis: Secondary | ICD-10-CM

## 2018-05-17 DIAGNOSIS — Z6831 Body mass index (BMI) 31.0-31.9, adult: Secondary | ICD-10-CM

## 2018-05-17 DIAGNOSIS — E6609 Other obesity due to excess calories: Secondary | ICD-10-CM

## 2018-05-17 DIAGNOSIS — F17200 Nicotine dependence, unspecified, uncomplicated: Secondary | ICD-10-CM | POA: Diagnosis not present

## 2018-05-17 MED ORDER — FAMOTIDINE 20 MG PO TABS: 20.0000 mg | ORAL_TABLET | Freq: Two times a day (BID) | ORAL | 1 refills | Status: DC

## 2018-05-17 NOTE — Patient Instructions (Signed)

## 2018-05-17 NOTE — Progress Notes (Signed)
[facility] [address] [place], [place] [address]  Internal MEDICINE  Office Visit Note  Patient Name: Grace [name]  [id number]  [id number]  Date of Service: 05/17/2018   Complaints/HPI Pt is here for establishment of PCP. Chief Complaint  Patient presents with  . Medical Management of Chronic Issues     new patient , having some issues with acid reflux , would like blood work done    HPI Pt report for the last 5 years she has not seen a PCP, she has only seen her OBGYN or the Hospital for her care. She reports her mother just found out she was pre-diabetic.  She reports there is a lot of cancer in her family, especially lung cancer. She reports she is a smoker of cigars, and she smokes approximately 5 black and milds daily, and she is drinking approximately 1 bottle of wine on the weekends.  She also may drink some during the week, on one or two occasions.  She also smokes marijuana daily.    Current Medication: Outpatient Encounter Medications as of 05/17/2018  Medication Sig  . fluticasone (FLONASE) 50 MCG/ACT nasal spray instill 2 sprays into each nostril once daily  . valACYclovir (VALTREX) 500 MG tablet Take 1 tablet (500 mg total) by mouth 2 (two) times daily.   No facility-administered encounter medications on file as of 05/17/2018.     Surgical History: Past Surgical History:  Procedure Laterality Date  . Cesearen Secdtion N/A 01/05/2013    Medical History: Past Medical History:  Diagnosis Date  . Anemia   . BV (bacterial vaginosis)   . Genital herpes   . Gonorrhea   . Trichomoniasis     Family History: Family History  Problem Relation Age of Onset  . Lung cancer Maternal Grandmother   . Lung cancer Maternal Grandfather     Social History   Socioeconomic History  . Marital status: Single    Spouse name: Not on file  . Number of children: Not on file  . Years of education: Not on file  . Highest education level: Not on file   Occupational History  . Not on file  Social Needs  . Financial resource strain: Not on file  . Food insecurity:    Worry: Not on file    Inability: Not on file  . Transportation needs:    Medical: Not on file    Non-medical: Not on file  Tobacco Use  . Smoking status: Current Every Day Smoker    Packs/day: 1.00    Types: Cigars  . Smokeless tobacco: Never Used  Substance and Sexual Activity  . Alcohol use: Yes    Comment: occ  . Drug use: Yes    Types: Marijuana  . Sexual activity: Yes    Birth control/protection: None  Lifestyle  . Physical activity:    Days per week: 0 days    Minutes per session: 0 min  . Stress: Not at all  Relationships  . Social connections:    Talks on phone: Three times a week    Gets together: More than three times a week    Attends religious service: Never    Active member of club or organization: No    Attends meetings of clubs or organizations: Never    Relationship status: Never married  . Intimate partner violence:    Fear of current or ex partner: No    Emotionally abused: No    Physically abused: No  Forced sexual activity: No  Other Topics Concern  . Not on file  Social History Narrative  . Not on file     Review of Systems  Constitutional: Negative for chills, fatigue and unexpected weight change.  HENT: Negative for congestion, rhinorrhea, sneezing and sore throat.   Eyes: Negative for photophobia, pain and redness.  Respiratory: Negative for cough, chest tightness and shortness of breath.   Cardiovascular: Negative for chest pain and palpitations.  Gastrointestinal: Negative for abdominal pain, constipation, diarrhea, nausea and vomiting.  Endocrine: Negative.   Genitourinary: Negative for dysuria and frequency.  Musculoskeletal: Negative for arthralgias, back pain, joint swelling and neck pain.  Skin: Negative for rash.  Allergic/Immunologic: Negative.   Neurological: Negative for tremors and numbness.   Hematological: Negative for adenopathy. Does not bruise/bleed easily.  Psychiatric/Behavioral: Negative for behavioral problems and sleep disturbance. The patient is not nervous/anxious.     Vital Signs: BP 96/72 (BP Location: Right Arm, Patient Position: Sitting, Cuff Size: Normal)   Pulse 70   Resp 16   Ht 5\' 3"  (1.6 m)   Wt 179 lb (81.2 kg)   SpO2 98%   BMI 31.71 kg/m    Physical Exam Vitals signs and nursing note reviewed.  Constitutional:      General: She is not in acute distress.    Appearance: She is well-developed. She is not diaphoretic.  HENT:     Head: Normocephalic and atraumatic.     Mouth/Throat:     Pharynx: No oropharyngeal exudate.  Eyes:     Pupils: Pupils are equal, round, and reactive to light.  Neck:     Musculoskeletal: Normal range of motion and neck supple.     Thyroid: No thyromegaly.     Vascular: No JVD.     Trachea: No tracheal deviation.  Cardiovascular:     Rate and Rhythm: Normal rate and regular rhythm.     Heart sounds: Normal heart sounds. No murmur. No friction rub. No gallop.   Pulmonary:     Effort: Pulmonary effort is normal. No respiratory distress.     Breath sounds: Normal breath sounds. No wheezing or rales.  Chest:     Chest wall: No tenderness.  Abdominal:     Palpations: Abdomen is soft.     Tenderness: There is no abdominal tenderness. There is no guarding.  Musculoskeletal: Normal range of motion.  Lymphadenopathy:     Cervical: No cervical adenopathy.  Skin:    General: Skin is warm and dry.  Neurological:     Mental Status: She is alert and oriented to person, place, and time.     Cranial Nerves: No cranial nerve deficit.  Psychiatric:        Behavior: Behavior normal.        Thought Content: Thought content normal.        Judgment: Judgment normal.    Assessment/Plan: 1. Gastroesophageal reflux disease without esophagitis Try Pepcid twice daily for GERD symptoms.   - famotidine (PEPCID) 20 MG tablet; Take  1 tablet (20 mg total) by mouth 2 (two) times daily.  Dispense: 30 tablet; Refill: 1  2. Nicotine dependence with current use Smoking cessation counseling: 1. Pt acknowledges the risks of long term smoking, she will try to quite smoking. 2. Options for different medications including nicotine products, chewing gum, patch etc, Wellbutrin and Chantix is discussed 3. Goal and date of compete cessation is discussed 4. Total time spent in smoking cessation is 15 min.   3.  Class 1 obesity due to excess calories without serious comorbidity with body mass index (BMI) of 31.0 to 31.9 in adult Obesity Counseling: Risk Assessment: An assessment of behavioral risk factors was made today and includes lack of exercise sedentary lifestyle, lack of portion control and poor dietary habits.  Risk Modification Advice: She was counseled on portion control guidelines. Restricting daily caloric intake to. . The detrimental long term effects of obesity on her health and ongoing poor compliance was also discussed with the patient.    General Counseling: Grace [name] verbalizes understanding of the findings of todays visit and agrees with plan of treatment. I have discussed any further diagnostic evaluation that may be needed or ordered today. We also reviewed her medications today. she has been encouraged to call the office with any questions or concerns that should arise related to todays visit.  No orders of the defined types were placed in this encounter.   No orders of the defined types were placed in this encounter.   Time spent: 35 Minutes   This patient was seen Blima Ledgerarboro AGNP-C in Collaboration with Dr Lyndon Code as a part of collaborative care agreement  Johnna Acosta AGNP-C Internal Medicine

## 2018-05-19 ENCOUNTER — Encounter: Payer: Self-pay | Admitting: Obstetrics and Gynecology

## 2018-05-20 ENCOUNTER — Other Ambulatory Visit: Payer: Self-pay | Admitting: Obstetrics and Gynecology

## 2018-05-20 DIAGNOSIS — B9689 Other specified bacterial agents as the cause of diseases classified elsewhere: Secondary | ICD-10-CM

## 2018-05-20 MED ORDER — CLINDAMYCIN HCL 300 MG PO CAPS: 300.0000 mg | ORAL_CAPSULE | Freq: Two times a day (BID) | ORAL | 0 refills | Status: DC

## 2018-05-20 NOTE — Progress Notes (Signed)
Rx clindamycin for recurrent BV sx. 

## 2018-06-07 ENCOUNTER — Other Ambulatory Visit: Payer: Self-pay | Admitting: Advanced Practice Midwife

## 2018-06-07 DIAGNOSIS — A6004 Herpesviral vulvovaginitis: Secondary | ICD-10-CM

## 2018-06-07 NOTE — Telephone Encounter (Signed)
Please advise 

## 2018-06-21 ENCOUNTER — Other Ambulatory Visit: Payer: Self-pay | Admitting: Adult Health

## 2018-06-22 LAB — CBC WITH DIFFERENTIAL/PLATELET
Basophils Absolute: 0.1 10*3/uL (ref 0.0–0.2)
Basos: 1 %
EOS (ABSOLUTE): 0.2 10*3/uL (ref 0.0–0.4)
Eos: 3 %
Hematocrit: 40 % (ref 34.0–46.6)
Hemoglobin: 13 g/dL (ref 11.1–15.9)
Immature Grans (Abs): 0 10*3/uL (ref 0.0–0.1)
Immature Granulocytes: 1 %
Lymphocytes Absolute: 2.9 10*3/uL (ref 0.7–3.1)
Lymphs: 36 %
MCH: 26.6 pg (ref 26.6–33.0)
MCHC: 32.5 g/dL (ref 31.5–35.7)
MCV: 82 fL (ref 79–97)
Monocytes Absolute: 0.6 10*3/uL (ref 0.1–0.9)
Monocytes: 8 %
Neutrophils Absolute: 4.2 10*3/uL (ref 1.4–7.0)
Neutrophils: 51 %
Platelets: 298 10*3/uL (ref 150–450)
RBC: 4.88 x10E6/uL (ref 3.77–5.28)
RDW: 13.9 % (ref 11.7–15.4)
WBC: 8 10*3/uL (ref 3.4–10.8)

## 2018-06-22 LAB — COMPREHENSIVE METABOLIC PANEL
ALT: 26 IU/L (ref 0–32)
AST: 19 IU/L (ref 0–40)
Albumin/Globulin Ratio: 2.1 (ref 1.2–2.2)
Albumin: 4.6 g/dL (ref 3.9–5.0)
Alkaline Phosphatase: 84 IU/L (ref 39–117)
BUN/Creatinine Ratio: 13 (ref 9–23)
BUN: 13 mg/dL (ref 6–20)
Bilirubin Total: <0.2 mg/dL (ref 0.0–1.2)
CO2: 23 mmol/L (ref 20–29)
Calcium: 9.8 mg/dL (ref 8.7–10.2)
Chloride: 102 mmol/L (ref 96–106)
Creatinine, Ser: 1.01 mg/dL — ABNORMAL HIGH (ref 0.57–1.00)
GFR calc Af Amer: 86 mL/min/{1.73_m2} (ref >59)
GFR calc non Af Amer: 75 mL/min/{1.73_m2} (ref >59)
Globulin, Total: 2.2 g/dL (ref 1.5–4.5)
Glucose: 91 mg/dL (ref 65–99)
Potassium: 4.7 mmol/L (ref 3.5–5.2)
Sodium: 140 mmol/L (ref 134–144)
Total Protein: 6.8 g/dL (ref 6.0–8.5)

## 2018-06-22 LAB — LIPID PANEL WITH LDL/HDL RATIO
Cholesterol, Total: 173 mg/dL (ref 100–199)
HDL: 76 mg/dL (ref >39)
LDL Calculated: 81 mg/dL (ref 0–99)
LDl/HDL Ratio: 1.1 ratio (ref 0.0–3.2)
Triglycerides: 79 mg/dL (ref 0–149)
VLDL Cholesterol Cal: 16 mg/dL (ref 5–40)

## 2018-06-22 LAB — T4, FREE: Free T4: 1.15 ng/dL (ref 0.82–1.77)

## 2018-06-22 LAB — TSH: TSH: 1.67 u[IU]/mL (ref 0.450–4.500)

## 2018-06-22 LAB — HGB A1C W/O EAG: Hgb A1c MFr Bld: 5.2 % (ref 4.8–5.6)

## 2018-06-28 ENCOUNTER — Encounter: Payer: Medicaid Other | Admitting: Adult Health

## 2018-07-07 ENCOUNTER — Ambulatory Visit: Payer: Medicaid Other | Admitting: Adult Health

## 2018-07-07 ENCOUNTER — Other Ambulatory Visit: Payer: Self-pay

## 2018-07-07 ENCOUNTER — Encounter: Payer: Self-pay | Admitting: Adult Health

## 2018-07-07 VITALS — BP 99/63 | HR 67 | Temp 98.3°F | Resp 16 | Ht 63.0 in | Wt 185.0 lb

## 2018-07-07 DIAGNOSIS — K219 Gastro-esophageal reflux disease without esophagitis: Secondary | ICD-10-CM

## 2018-07-07 DIAGNOSIS — Z0001 Encounter for general adult medical examination with abnormal findings: Secondary | ICD-10-CM | POA: Diagnosis not present

## 2018-07-07 DIAGNOSIS — R300 Dysuria: Secondary | ICD-10-CM | POA: Diagnosis not present

## 2018-07-07 DIAGNOSIS — B009 Herpesviral infection, unspecified: Secondary | ICD-10-CM

## 2018-07-07 DIAGNOSIS — F17200 Nicotine dependence, unspecified, uncomplicated: Secondary | ICD-10-CM | POA: Diagnosis not present

## 2018-07-07 DIAGNOSIS — Z6831 Body mass index (BMI) 31.0-31.9, adult: Secondary | ICD-10-CM

## 2018-07-07 DIAGNOSIS — E6609 Other obesity due to excess calories: Secondary | ICD-10-CM

## 2018-07-07 DIAGNOSIS — K591 Functional diarrhea: Secondary | ICD-10-CM

## 2018-07-07 MED ORDER — VALACYCLOVIR HCL 1 G PO TABS: 1000.0000 mg | ORAL_TABLET | Freq: Every day | ORAL | 5 refills | Status: DC

## 2018-07-07 NOTE — Progress Notes (Signed)
[facility] [address] [place], [place] [address]  Internal MEDICINE  Office Visit Note  Patient Name: Grace [name]  1987  [id number]  Date of Service: 07/07/2018  Chief Complaint  Patient presents with  . Medical Management of Chronic Issues  . Annual Exam  . Anemia  . Diarrhea    for the last week and half      HPI Pt is here for routine health maintenance examination.  She is a well appearing  33 yo AA female.  She has a history of anemia, herpes infection and gerd.  She denies any present issues.  Her lab results were reviewed with her.  She does report some increased incidence of herpes outbreaks.  Her Grace [name] has [name] well controlled and her CBC is normal at this time.    Current Medication: Outpatient Encounter Medications as of 07/07/2018  Medication Sig  . famotidine (PEPCID) 20 MG tablet Take 1 tablet (20 mg total) by mouth 2 (two) times daily.  . fluticasone (FLONASE) 50 MCG/ACT nasal spray instill 2 sprays into each nostril once daily  . valACYclovir (VALTREX) 500 MG tablet Take 1 tablet (500 mg total) by mouth 2 (two) times daily. For 3 days prn sx.   No facility-administered encounter medications on file as of 07/07/2018.     Surgical History: Past Surgical History:  Procedure Laterality Date  . Cesearen Secdtion N/A 01/05/2013    Medical History: Past Medical History:  Diagnosis Date  . Anemia   . BV (bacterial vaginosis)   . Genital herpes   . Gonorrhea   . Trichomoniasis     Family History: Family History  Problem Relation Age of Onset  . Lung cancer Maternal Grandmother   . Lung cancer Maternal Grandfather       Review of Systems  Constitutional: Negative for chills, fatigue and unexpected weight change.  HENT: Negative for congestion, rhinorrhea, sneezing and sore throat.   Eyes: Negative for photophobia, pain and redness.  Respiratory: Negative for cough, chest tightness and shortness of breath.   Cardiovascular:  Negative for chest pain and palpitations.  Gastrointestinal: Negative for abdominal pain, constipation, diarrhea, nausea and vomiting.  Endocrine: Negative.   Genitourinary: Negative for dysuria and frequency.  Musculoskeletal: Negative for arthralgias, back pain, joint swelling and neck pain.  Skin: Negative for rash.  Allergic/Immunologic: Negative.   Neurological: Negative for tremors and numbness.  Hematological: Negative for adenopathy. Does not bruise/bleed easily.  Psychiatric/Behavioral: Negative for behavioral problems and sleep disturbance. The patient is not nervous/anxious.      Vital Signs: BP 99/63 (BP Location: Left Arm, Patient Position: Sitting, Cuff Size: Normal)   Pulse 67   Temp 98.3 F (36.8 C)   Resp 16   Ht 5\' 3"  (1.6 m)   Wt 185 lb (83.9 kg)   SpO2 99%   BMI 32.77 kg/m    Physical Exam Vitals signs and nursing note reviewed.  Constitutional:      General: She is not in acute distress.    Appearance: She is well-developed. She is not diaphoretic.  HENT:     Head: Normocephalic and atraumatic.     Mouth/Throat:     Pharynx: No oropharyngeal exudate.  Eyes:     Pupils: Pupils are equal, round, and reactive to light.  Neck:     Musculoskeletal: Normal range of motion and neck supple.     Thyroid: No thyromegaly.     Vascular: No JVD.     Trachea: No  tracheal deviation.  Cardiovascular:     Rate and Rhythm: Normal rate and regular rhythm.     Heart sounds: Normal heart sounds. No murmur. No friction rub. No gallop.   Pulmonary:     Effort: Pulmonary effort is normal. No respiratory distress.     Breath sounds: Normal breath sounds. No wheezing or rales.  Chest:     Chest wall: No tenderness.  Abdominal:     Palpations: Abdomen is soft.     Tenderness: There is no abdominal tenderness. There is no guarding.  Musculoskeletal: Normal range of motion.  Lymphadenopathy:     Cervical: No cervical adenopathy.  Skin:    General: Skin is warm and  dry.  Neurological:     Mental Status: She is alert and oriented to person, place, and time.     Cranial Nerves: No cranial nerve deficit.  Psychiatric:        Behavior: Behavior normal.        Thought Content: Thought content normal.        Judgment: Judgment normal.      LABS: Recent Results (from the past 2160 hour(s))  CBC with Differential/Platelet     Status: None   Collection Time: 06/21/18 10:44 AM  Result Value Ref Range   WBC 8.0 3.4 - 10.8 x10E3/uL   RBC 4.88 3.77 - 5.28 x10E6/uL   Hemoglobin 13.0 11.1 - 15.9 g/dL   Hematocrit 16.140.0 09.634.0 - 46.6 %   MCV 82 79 - 97 fL   MCH 26.6 26.6 - 33.0 pg   MCHC 32.5 31.5 - 35.7 g/dL   RDW 04.513.9 40.911.7 - 81.115.4 %   Platelets 298 150 - 450 x10E3/uL   Neutrophils 51 Not Estab. %   Lymphs 36 Not Estab. %   Monocytes 8 Not Estab. %   Eos 3 Not Estab. %   Basos 1 Not Estab. %   Neutrophils Absolute 4.2 1.4 - 7.0 x10E3/uL   Lymphocytes Absolute 2.9 0.7 - 3.1 x10E3/uL   Monocytes Absolute 0.6 0.1 - 0.9 x10E3/uL   EOS (ABSOLUTE) 0.2 0.0 - 0.4 x10E3/uL   Basophils Absolute 0.1 0.0 - 0.2 x10E3/uL   Immature Granulocytes 1 Not Estab. %   Immature Grans (Abs) 0.0 0.0 - 0.1 x10E3/uL  Comprehensive metabolic panel     Status: Abnormal   Collection Time: 06/21/18 10:44 AM  Result Value Ref Range   Glucose 91 65 - 99 mg/dL   BUN 13 6 - 20 mg/dL   Creatinine, Ser 9.141.01 (H) 0.57 - 1.00 mg/dL   GFR calc non Af Amer 75 >59 mL/min/1.73   GFR calc Af Amer 86 >59 mL/min/1.73   BUN/Creatinine Ratio 13 9 - 23   Sodium 140 134 - 144 mmol/L   Potassium 4.7 3.5 - 5.2 mmol/L   Chloride 102 96 - 106 mmol/L   CO2 23 20 - 29 mmol/L   Calcium 9.8 8.7 - 10.2 mg/dL   Total Protein 6.8 6.0 - 8.5 g/dL   Albumin 4.6 3.9 - 5.0 g/dL   Globulin, Total 2.2 1.5 - 4.5 g/dL   Albumin/Globulin Ratio 2.1 1.2 - 2.2   Bilirubin Total <0.2 0.0 - 1.2 mg/dL   Alkaline Phosphatase 84 39 - 117 IU/L   AST 19 0 - 40 IU/L   ALT 26 0 - 32 IU/L  Lipid Panel With LDL/HDL  Ratio     Status: None   Collection Time: 06/21/18 10:44 AM  Result Value Ref Range   Cholesterol,  Total 173 100 - 199 mg/dL   Triglycerides 79 0 - 149 mg/dL   HDL 76 >16>39 mg/dL   VLDL Cholesterol Cal 16 5 - 40 mg/dL   LDL Calculated 81 0 - 99 mg/dL   LDl/HDL Ratio 1.1 0.0 - 3.2 ratio    Comment:                                     LDL/HDL Ratio                                             Men  Women                               1/2 Avg.Risk  1.0    1.5                                   Avg.Risk  3.6    3.2                                2X Avg.Risk  6.2    5.0                                3X Avg.Risk  8.0    6.1   Hgb A1c w/o eAG     Status: None   Collection Time: 06/21/18 10:44 AM  Result Value Ref Range   Hgb A1c MFr Bld 5.2 4.8 - 5.6 %    Comment:          Prediabetes: 5.7 - 6.4          Diabetes: >6.4          Glycemic control for adults with diabetes: <7.0   T4, free     Status: None   Collection Time: 06/21/18 10:44 AM  Result Value Ref Range   Free T4 1.15 0.82 - 1.77 ng/dL  TSH     Status: None   Collection Time: 06/21/18 10:44 AM  Result Value Ref Range   TSH 1.670 0.450 - 4.500 uIU/mL    Assessment/Plan: 1. Encounter for general adult medical examination with abnormal findings Pt is up to date on PHM.   2. Herpes infection Started patient on 1000mg  valtrex daily for suppressive therapy.  Pt is having monthly outbreaks at this time.   3. Gastroesophageal reflux disease without esophagitis Stable, continue to use Pepcid as discussed.  Pt is having excellent results since starting pepcid.   4. Nicotine dependence with current use Smoking cessation counseling: 1. Pt acknowledges the risks of long term smoking, she will try to quite smoking. 2. Options for different medications including nicotine products, chewing gum, patch etc, Wellbutrin and Chantix is discussed 3. Goal and date of compete cessation is discussed 4. Total time spent in smoking cessation is  15 min.  5. Class 1 obesity due to excess calories without serious comorbidity with body mass index (BMI) of 31.0 to 31.9 in adult Obesity Counseling: Risk Assessment: An assessment of behavioral risk factors was made today and  includes lack of exercise sedentary lifestyle, lack of portion control and poor dietary habits.  Risk Modification Advice: She was counseled on portion control guidelines. Restricting daily caloric intake to. . The detrimental long term effects of obesity on her health and ongoing poor compliance was also discussed with the patient.  6. Dysuria - Urinalysis, Routine w reflex microscopic  7. Functional diarrhea Instructed patient to stay hydrated, and to take OTC anti-diarrhea medication for a few days and call clinic if it does not resolve.   General Counseling: Jonika verbalizes understanding of the findings of todays visit and agrees with plan of treatment. I have discussed any further diagnostic evaluation that may be needed or ordered today. We also reviewed her medications today. she has [name] encouraged to call the office with any questions or concerns that should arise related to todays visit.   Orders Placed This Encounter  Procedures  . Urinalysis, Routine w reflex microscopic    No orders of the defined types were placed in this encounter.   Time spent: 35 Minutes   This patient was seen by Orson Gear AGNP-C in Collaboration with Dr Lavera Guise as a part of collaborative care agreement    Kendell Bane AGNP-C Internal Medicine

## 2018-07-08 LAB — URINALYSIS, ROUTINE W REFLEX MICROSCOPIC
Bilirubin, UA: NEGATIVE
Glucose, UA: NEGATIVE
Ketones, UA: NEGATIVE
Leukocytes,UA: NEGATIVE
Nitrite, UA: NEGATIVE
Protein,UA: NEGATIVE
RBC, UA: NEGATIVE
Specific Gravity, UA: 1.018 (ref 1.005–1.030)
Urobilinogen, Ur: 0.2 mg/dL (ref 0.2–1.0)
pH, UA: 5 (ref 5.0–7.5)

## 2018-08-15 ENCOUNTER — Encounter: Payer: Self-pay | Admitting: Obstetrics and Gynecology

## 2018-08-16 ENCOUNTER — Other Ambulatory Visit: Payer: Self-pay | Admitting: Obstetrics and Gynecology

## 2018-08-16 DIAGNOSIS — N898 Other specified noninflammatory disorders of vagina: Secondary | ICD-10-CM

## 2018-08-16 MED ORDER — FLUCONAZOLE 150 MG PO TABS: 150.0000 mg | ORAL_TABLET | Freq: Once | ORAL | 0 refills | Status: DC

## 2018-08-16 NOTE — Progress Notes (Signed)
Rx diflucan for yeast vag sx.  

## 2018-10-08 ENCOUNTER — Other Ambulatory Visit: Payer: Self-pay

## 2018-10-08 DIAGNOSIS — Z20822 Contact with and (suspected) exposure to covid-19: Secondary | ICD-10-CM

## 2018-10-09 LAB — NOVEL CORONAVIRUS, NAA: SARS-CoV-2, NAA: NOT DETECTED

## 2018-11-10 ENCOUNTER — Other Ambulatory Visit: Payer: Self-pay

## 2018-11-10 ENCOUNTER — Ambulatory Visit: Payer: Medicaid Other | Admitting: Adult Health

## 2018-11-10 ENCOUNTER — Encounter: Payer: Self-pay | Admitting: Adult Health

## 2018-11-10 VITALS — BP 105/67 | HR 87 | Temp 97.8°F | Resp 16 | Ht 63.0 in | Wt 185.4 lb

## 2018-11-10 DIAGNOSIS — F17200 Nicotine dependence, unspecified, uncomplicated: Secondary | ICD-10-CM | POA: Diagnosis not present

## 2018-11-10 DIAGNOSIS — K649 Unspecified hemorrhoids: Secondary | ICD-10-CM

## 2018-11-10 DIAGNOSIS — K219 Gastro-esophageal reflux disease without esophagitis: Secondary | ICD-10-CM

## 2018-11-10 MED ORDER — DOCUSATE SODIUM 100 MG PO CAPS: 100.0000 mg | ORAL_CAPSULE | Freq: Two times a day (BID) | ORAL | 0 refills | Status: DC

## 2018-11-10 MED ORDER — LIDOCAINE-HYDROCORTISONE ACE 3-1 % RE KIT: 1.0000 "application " | PACK | Freq: Two times a day (BID) | RECTAL | 0 refills | Status: DC

## 2018-11-10 NOTE — Progress Notes (Addendum)
[facility] [place], [place] [address]  Internal MEDICINE  Office Visit Note  Patient Name: Grace [name]  [id number]  [id number]  Date of Service: 11/17/2018  Chief Complaint  Patient presents with  . Hemorrhoids    believes to be internal, has been frequent and pt states she has at least had 8 this year     HPI Pt is here for a sick visit.  Pt reports 8 episodes of hemorrhoids this year.  She reports some pain and itching when these flare up.  She reports feeling like she has stool stuck in her rectum. However, she does not feel any stool in the vault per her self digital exam.   She has pain that does not go away even when she lays down.    Current Medication:  Outpatient Encounter Medications as of 11/10/2018  Medication Sig  . famotidine (PEPCID) 20 MG tablet Take 1 tablet (20 mg total) by mouth 2 (two) times daily.  . fluticasone (FLONASE) 50 MCG/ACT nasal spray instill 2 sprays into each nostril once daily  . valACYclovir (VALTREX) 1000 MG tablet Take 1 tablet (1,000 mg total) by mouth daily.  Marland Kitchen docusate sodium (COLACE) 100 MG capsule Take 1 capsule (100 mg total) by mouth 2 (two) times daily.  Marland Kitchen lidocaine-hydrocortisone (ANAMANTLE) 3-1 % KIT Place 1 application rectally 2 (two) times daily.   No facility-administered encounter medications on file as of 11/10/2018.       Medical History: Past Medical History:  Diagnosis Date  . Anemia   . BV (bacterial vaginosis)   . Genital herpes   . Gonorrhea   . Trichomoniasis      Vital Signs: BP 105/67   Pulse 87   Temp 97.8 F (36.6 C)   Resp 16   Ht '5\' 3"'  (1.6 m)   Wt 185 lb 6.4 oz (84.1 kg)   SpO2 97%   BMI 32.84 kg/m    Review of Systems  Constitutional: Negative for chills, fatigue and unexpected weight change.  HENT: Negative for congestion, rhinorrhea, sneezing and sore throat.   Eyes: Negative for photophobia, pain and redness.  Respiratory: Negative for cough, chest  tightness and shortness of breath.   Cardiovascular: Negative for chest pain and palpitations.  Gastrointestinal: Negative for abdominal pain, constipation, diarrhea, nausea and vomiting.  Endocrine: Negative.   Genitourinary: Negative for dysuria and frequency.  Musculoskeletal: Negative for arthralgias, back pain, joint swelling and neck pain.  Skin: Negative for rash.  Allergic/Immunologic: Negative.   Neurological: Negative for tremors and numbness.  Hematological: Negative for adenopathy. Does not bruise/bleed easily.  Psychiatric/Behavioral: Negative for behavioral problems and sleep disturbance. The patient is not nervous/anxious.     Physical Exam Vitals signs and nursing note reviewed.  Constitutional:      General: She is not in acute distress.    Appearance: She is well-developed. She is not diaphoretic.  HENT:     Head: Normocephalic and atraumatic.     Mouth/Throat:     Pharynx: No oropharyngeal exudate.  Eyes:     Pupils: Pupils are equal, round, and reactive to light.  Neck:     Musculoskeletal: Normal range of motion and neck supple.     Thyroid: No thyromegaly.     Vascular: No JVD.     Trachea: No tracheal deviation.  Cardiovascular:     Rate and Rhythm: Normal rate and regular rhythm.     Heart sounds: Normal heart sounds. No murmur. No  friction rub. No gallop.   Pulmonary:     Effort: Pulmonary effort is normal. No respiratory distress.     Breath sounds: Normal breath sounds. No wheezing or rales.  Chest:     Chest wall: No tenderness.  Abdominal:     Palpations: Abdomen is soft.     Tenderness: There is no abdominal tenderness. There is no guarding.  Musculoskeletal: Normal range of motion.  Lymphadenopathy:     Cervical: No cervical adenopathy.  Skin:    General: Skin is warm and dry.  Neurological:     Mental Status: She is alert and oriented to person, place, and time.     Cranial Nerves: No cranial nerve deficit.  Psychiatric:         Behavior: Behavior normal.        Thought Content: Thought content normal.        Judgment: Judgment normal.    Assessment/Plan: 1. Hemorrhoids, unspecified hemorrhoid type PT declined surgical consult at this time.  Will prescribed colace, and anamantle.  Pt will call if no relief, or if she decides she would like to see surgeon.  - docusate sodium (COLACE) 100 MG capsule; Take 1 capsule (100 mg total) by mouth 2 (two) times daily.  Dispense: 60 capsule; Refill: 0 - lidocaine-hydrocortisone (ANAMANTLE) 3-1 % KIT; Place 1 application rectally 2 (two) times daily.  Dispense: 1 kit; Refill: 0  2. Gastroesophageal reflux disease without esophagitis Stable, continue present management.   3. Nicotine dependence with current use Smoking cessation counseling: 1. Pt acknowledges the risks of long term smoking, she will try to quite smoking. 2. Options for different medications including nicotine products, chewing gum, patch etc, Wellbutrin and Chantix is discussed 3. Goal and date of compete cessation is discussed 4. Total time spent in smoking cessation is 15 min.   General Counseling: Devona verbalizes understanding of the findings of todays visit and agrees with plan of treatment. I have discussed any further diagnostic evaluation that may be needed or ordered today. We also reviewed her medications today. she has been encouraged to call the office with any questions or concerns that should arise related to todays visit.   No orders of the defined types were placed in this encounter.   Meds ordered this encounter  Medications  . docusate sodium (COLACE) 100 MG capsule    Sig: Take 1 capsule (100 mg total) by mouth 2 (two) times daily.    Dispense:  60 capsule    Refill:  0  . lidocaine-hydrocortisone (ANAMANTLE) 3-1 % KIT    Sig: Place 1 application rectally 2 (two) times daily.    Dispense:  1 kit    Refill:  0    Time spent: 25 Minutes  This patient was seen by Orson Gear  AGNP-C in Collaboration with Dr Lavera Guise as a part of collaborative care agreement.  Kendell Bane AGNP-C Internal Medicine

## 2018-11-17 ENCOUNTER — Ambulatory Visit: Payer: Medicaid Other | Admitting: Adult Health

## 2018-11-17 NOTE — Addendum Note (Signed)
Addended byVersie Starks, Rodert Hinch J on: 11/17/2018 01:28 PM   Modules accepted: Level of Service

## 2018-12-13 ENCOUNTER — Other Ambulatory Visit: Payer: Self-pay | Admitting: Obstetrics and Gynecology

## 2018-12-13 ENCOUNTER — Encounter: Payer: Self-pay | Admitting: Obstetrics and Gynecology

## 2018-12-13 DIAGNOSIS — N898 Other specified noninflammatory disorders of vagina: Secondary | ICD-10-CM

## 2018-12-13 MED ORDER — FLUCONAZOLE 150 MG PO TABS: 150.0000 mg | ORAL_TABLET | Freq: Once | ORAL | 0 refills | Status: AC

## 2018-12-13 NOTE — Progress Notes (Signed)
Rx RF diflucan for yeast vag sx 

## 2019-01-17 ENCOUNTER — Other Ambulatory Visit: Payer: Self-pay | Admitting: Obstetrics and Gynecology

## 2019-01-17 ENCOUNTER — Encounter: Payer: Self-pay | Admitting: Obstetrics and Gynecology

## 2019-01-17 DIAGNOSIS — N760 Acute vaginitis: Secondary | ICD-10-CM

## 2019-01-17 DIAGNOSIS — B9689 Other specified bacterial agents as the cause of diseases classified elsewhere: Secondary | ICD-10-CM

## 2019-01-17 MED ORDER — CLINDAMYCIN HCL 300 MG PO CAPS: 300.0000 mg | ORAL_CAPSULE | Freq: Two times a day (BID) | ORAL | 0 refills | Status: AC

## 2019-01-17 NOTE — Progress Notes (Signed)
Rx RF clindamycin fo rBV 

## 2019-02-27 ENCOUNTER — Encounter: Payer: Self-pay | Admitting: Obstetrics and Gynecology

## 2019-05-26 ENCOUNTER — Telehealth: Payer: Self-pay

## 2019-05-26 ENCOUNTER — Other Ambulatory Visit: Payer: Self-pay | Admitting: Adult Health

## 2019-05-26 DIAGNOSIS — K649 Unspecified hemorrhoids: Secondary | ICD-10-CM

## 2019-05-26 DIAGNOSIS — K219 Gastro-esophageal reflux disease without esophagitis: Secondary | ICD-10-CM

## 2019-05-26 NOTE — Telephone Encounter (Signed)
Called lmom informing patient of appointment on 05/30/2019. klh 

## 2019-05-30 ENCOUNTER — Encounter: Payer: Self-pay | Admitting: Adult Health

## 2019-05-30 ENCOUNTER — Ambulatory Visit (INDEPENDENT_AMBULATORY_CARE_PROVIDER_SITE_OTHER): Payer: Medicaid Other | Admitting: Adult Health

## 2019-05-30 VITALS — BP 108/69 | HR 85 | Temp 97.3°F | Resp 16 | Ht 63.0 in | Wt 190.0 lb

## 2019-05-30 DIAGNOSIS — B009 Herpesviral infection, unspecified: Secondary | ICD-10-CM

## 2019-05-30 DIAGNOSIS — R42 Dizziness and giddiness: Secondary | ICD-10-CM | POA: Diagnosis not present

## 2019-05-30 DIAGNOSIS — K649 Unspecified hemorrhoids: Secondary | ICD-10-CM

## 2019-05-30 DIAGNOSIS — Z0001 Encounter for general adult medical examination with abnormal findings: Secondary | ICD-10-CM

## 2019-05-30 DIAGNOSIS — F17200 Nicotine dependence, unspecified, uncomplicated: Secondary | ICD-10-CM | POA: Diagnosis not present

## 2019-05-30 DIAGNOSIS — K219 Gastro-esophageal reflux disease without esophagitis: Secondary | ICD-10-CM | POA: Diagnosis not present

## 2019-05-30 DIAGNOSIS — Z6833 Body mass index (BMI) 33.0-33.9, adult: Secondary | ICD-10-CM

## 2019-05-30 NOTE — Progress Notes (Signed)
[facility] [place], [place] [address]  Internal MEDICINE  Office Visit Note  Patient Name: Grace [name]  [id number]  [id number]  Date of Service: 05/30/2019  Chief Complaint  Patient presents with  . Follow-up    dizzy spells for 2weeks     HPI  PT is here for follow up.  Today she reports episodes of dizziness. The first episode was 2 weeks ago while she was laying in bed.  She reports her vision became distorted, and blurry.  This lasted for some time. She ended up going to sleep but did have some nausea. She reports this happened to her years ago while she was pregnant.  She was told she had iron deficiently at that time. She reports 3 episodes of hemorrhoid flare ups since our last visit. She would still like to wait, and not see surgery at this time.    Current Medication: Outpatient Encounter Medications as of 05/30/2019  Medication Sig  . DOK 100 MG capsule TAKE 1 CAPSULE(100 MG) BY MOUTH TWICE DAILY  . famotidine (PEPCID) 20 MG tablet TAKE 1 TABLET(20 MG) BY MOUTH TWICE DAILY  . valACYclovir (VALTREX) 1000 MG tablet Take 1 tablet (1,000 mg total) by mouth daily.  . fluticasone (FLONASE) 50 MCG/ACT nasal spray instill 2 sprays into each nostril once daily  . lidocaine-hydrocortisone (ANAMANTLE) 3-1 % KIT Place 1 application rectally 2 (two) times daily.   No facility-administered encounter medications on file as of 05/30/2019.    Surgical History: Past Surgical History:  Procedure Laterality Date  . Cesearen Secdtion N/A 01/05/2013    Medical History: Past Medical History:  Diagnosis Date  . Anemia   . BV (bacterial vaginosis)   . Genital herpes   . Gonorrhea   . Trichomoniasis     Family History: Family History  Problem Relation Age of Onset  . Lung cancer Maternal Grandmother   . Lung cancer Maternal Grandfather     Social History   Socioeconomic History  . Marital status: Single    Spouse name: Not on file  . Number  of children: Not on file  . Years of education: Not on file  . Highest education level: Not on file  Occupational History  . Not on file  Tobacco Use  . Smoking status: Current Every Day Smoker    Packs/day: 1.00    Types: Cigars  . Smokeless tobacco: Never Used  Substance and Sexual Activity  . Alcohol use: Yes    Comment: occ  . Drug use: Yes    Types: Marijuana  . Sexual activity: Yes    Birth control/protection: None  Other Topics Concern  . Not on file  Social History Narrative  . Not on file   Social Determinants of Health   Financial Resource Strain:   . Difficulty of Paying Living Expenses:   Food Insecurity:   . Worried About Charity fundraiser in the Last Year:   . Arboriculturist in the Last Year:   Transportation Needs:   . Film/video editor (Medical):   Marland Kitchen Lack of Transportation (Non-Medical):   Physical Activity:   . Days of Exercise per Week:   . Minutes of Exercise per Session:   Stress:   . Feeling of Stress :   Social Connections:   . Frequency of Communication with Friends and Family:   . Frequency of Social Gatherings with Friends and Family:   . Attends Religious Services:   .  Active Member of Clubs or Organizations:   . Attends Archivist Meetings:   Marland Kitchen Marital Status:   Intimate Partner Violence:   . Fear of Current or Ex-Partner:   . Emotionally Abused:   Marland Kitchen Physically Abused:   . Sexually Abused:       Review of Systems  Constitutional: Negative for chills, fatigue and unexpected weight change.  HENT: Negative for congestion, rhinorrhea, sneezing and sore throat.   Eyes: Negative for photophobia, pain and redness.  Respiratory: Negative for cough, chest tightness and shortness of breath.   Cardiovascular: Negative for chest pain and palpitations.  Gastrointestinal: Negative for abdominal pain, constipation, diarrhea, nausea and vomiting.  Endocrine: Negative.   Genitourinary: Negative for dysuria and frequency.   Musculoskeletal: Negative for arthralgias, back pain, joint swelling and neck pain.  Skin: Negative for rash.  Allergic/Immunologic: Negative.   Neurological: Negative for tremors and numbness.  Hematological: Negative for adenopathy. Does not bruise/bleed easily.  Psychiatric/Behavioral: Negative for behavioral problems and sleep disturbance. The patient is not nervous/anxious.     Vital Signs: BP 108/69   Pulse 85   Temp (!) 97.3 F (36.3 C)   Resp 16   Ht '5\' 3"'  (1.6 m)   Wt 190 lb (86.2 kg)   SpO2 98%   BMI 33.66 kg/m    Physical Exam Vitals and nursing note reviewed.  Constitutional:      General: She is not in acute distress.    Appearance: She is well-developed. She is not diaphoretic.  HENT:     Head: Normocephalic and atraumatic.     Mouth/Throat:     Pharynx: No oropharyngeal exudate.  Eyes:     Pupils: Pupils are equal, round, and reactive to light.  Neck:     Thyroid: No thyromegaly.     Vascular: No JVD.     Trachea: No tracheal deviation.  Cardiovascular:     Rate and Rhythm: Normal rate and regular rhythm.     Heart sounds: Normal heart sounds. No murmur. No friction rub. No gallop.   Pulmonary:     Effort: Pulmonary effort is normal. No respiratory distress.     Breath sounds: Normal breath sounds. No wheezing or rales.  Chest:     Chest wall: No tenderness.  Abdominal:     Palpations: Abdomen is soft.     Tenderness: There is no abdominal tenderness. There is no guarding.  Musculoskeletal:        General: Normal range of motion.     Cervical back: Normal range of motion and neck supple.  Lymphadenopathy:     Cervical: No cervical adenopathy.  Skin:    General: Skin is warm and dry.  Neurological:     Mental Status: She is alert and oriented to person, place, and time.     Cranial Nerves: No cranial nerve deficit.  Psychiatric:        Behavior: Behavior normal.        Thought Content: Thought content normal.        Judgment: Judgment  normal.    Assessment/Plan: 1. Gastroesophageal reflux disease without esophagitis Continue with gerd treatment as prescribed.   2. Dizziness Recent episode of dizziness.  Self limiting.  If symptoms return, follow up in office.   3. Hemorrhoids, unspecified hemorrhoid type Continue to have intermittent issues.  She declines surgery referral at this time.   4. Nicotine dependence with current use Smoking cessation counseling: 1. Pt acknowledges the risks of long term  smoking, she will try to quite smoking. 2. Options for different medications including nicotine products, chewing gum, patch etc, Wellbutrin and Chantix is discussed 3. Goal and date of compete cessation is discussed 4. Total time spent in smoking cessation is 15 min.  5. Herpes infection No recent issues.  Continue to monitor.  6. BMI 33.0-33.9,adult Obesity Counseling: Risk Assessment: An assessment of behavioral risk factors was made today and includes lack of exercise sedentary lifestyle, lack of portion control and poor dietary habits.  Risk Modification Advice: She was counseled on portion control guidelines. Restricting daily caloric intake to 1600. The detrimental long term effects of obesity on her health and ongoing poor compliance was also discussed with the patient.  7. Encounter for general adult medical examination with abnormal findings Have labs drawn.for physical at next visit.  - CBC with Differential/Platelet - Lipid Panel With LDL/HDL Ratio - TSH - T4, free - Comprehensive metabolic panel - Vitamin D (25 hydroxy) - B12 and Folate Panel - Fe+TIBC+Fer  General Counseling: Auriana verbalizes understanding of the findings of todays visit and agrees with plan of treatment. I have discussed any further diagnostic evaluation that may be needed or ordered today. We also reviewed her medications today. she has been encouraged to call the office with any questions or concerns that should arise related to  todays visit.    No orders of the defined types were placed in this encounter.   No orders of the defined types were placed in this encounter.   Time spent:30 Minutes   This patient was seen by Orson Gear AGNP-C in Collaboration with Dr Lavera Guise as a part of collaborative care agreement     Kendell Bane AGNP-C Internal medicine

## 2019-05-31 LAB — COMPREHENSIVE METABOLIC PANEL
ALT: 22 IU/L (ref 0–32)
AST: 21 IU/L (ref 0–40)
Albumin/Globulin Ratio: 2 (ref 1.2–2.2)
Albumin: 4.3 g/dL (ref 3.8–4.8)
Alkaline Phosphatase: 100 IU/L (ref 48–121)
BUN/Creatinine Ratio: 12 (ref 9–23)
BUN: 11 mg/dL (ref 6–20)
Bilirubin Total: <0.2 mg/dL (ref 0.0–1.2)
CO2: 24 mmol/L (ref 20–29)
Calcium: 9.6 mg/dL (ref 8.7–10.2)
Chloride: 104 mmol/L (ref 96–106)
Creatinine, Ser: 0.91 mg/dL (ref 0.57–1.00)
GFR calc Af Amer: 97 mL/min/{1.73_m2} (ref >59)
GFR calc non Af Amer: 84 mL/min/{1.73_m2} (ref >59)
Globulin, Total: 2.1 g/dL (ref 1.5–4.5)
Glucose: 83 mg/dL (ref 65–99)
Potassium: 4.8 mmol/L (ref 3.5–5.2)
Sodium: 139 mmol/L (ref 134–144)
Total Protein: 6.4 g/dL (ref 6.0–8.5)

## 2019-05-31 LAB — CBC WITH DIFFERENTIAL/PLATELET
Basophils Absolute: 0 10*3/uL (ref 0.0–0.2)
Basos: 1 %
EOS (ABSOLUTE): 0.1 10*3/uL (ref 0.0–0.4)
Eos: 2 %
Hematocrit: 37.6 % (ref 34.0–46.6)
Hemoglobin: 12.2 g/dL (ref 11.1–15.9)
Immature Grans (Abs): 0 10*3/uL (ref 0.0–0.1)
Immature Granulocytes: 1 %
Lymphocytes Absolute: 1.9 10*3/uL (ref 0.7–3.1)
Lymphs: 31 %
MCH: 26.6 pg (ref 26.6–33.0)
MCHC: 32.4 g/dL (ref 31.5–35.7)
MCV: 82 fL (ref 79–97)
Monocytes Absolute: 0.6 10*3/uL (ref 0.1–0.9)
Monocytes: 9 %
Neutrophils Absolute: 3.6 10*3/uL (ref 1.4–7.0)
Neutrophils: 56 %
Platelets: 355 10*3/uL (ref 150–450)
RBC: 4.58 x10E6/uL (ref 3.77–5.28)
RDW: 13.9 % (ref 11.7–15.4)
WBC: 6.3 10*3/uL (ref 3.4–10.8)

## 2019-05-31 LAB — T4, FREE: Free T4: 1.1 ng/dL (ref 0.82–1.77)

## 2019-05-31 LAB — VITAMIN D 25 HYDROXY (VIT D DEFICIENCY, FRACTURES): Vit D, 25-Hydroxy: 14.8 ng/mL — ABNORMAL LOW (ref 30.0–100.0)

## 2019-05-31 LAB — LIPID PANEL WITH LDL/HDL RATIO
Cholesterol, Total: 146 mg/dL (ref 100–199)
HDL: 70 mg/dL (ref >39)
LDL Chol Calc (NIH): 63 mg/dL (ref 0–99)
LDL/HDL Ratio: 0.9 ratio (ref 0.0–3.2)
Triglycerides: 61 mg/dL (ref 0–149)
VLDL Cholesterol Cal: 13 mg/dL (ref 5–40)

## 2019-05-31 LAB — IRON,TIBC AND FERRITIN PANEL
Ferritin: 29 ng/mL (ref 15–150)
Iron Saturation: 13 % — ABNORMAL LOW (ref 15–55)
Iron: 43 ug/dL (ref 27–159)
Total Iron Binding Capacity: 341 ug/dL (ref 250–450)
UIBC: 298 ug/dL (ref 131–425)

## 2019-05-31 LAB — TSH: TSH: 0.796 u[IU]/mL (ref 0.450–4.500)

## 2019-05-31 LAB — B12 AND FOLATE PANEL
Folate: 15.3 ng/mL (ref >3.0)
Vitamin B-12: 711 pg/mL (ref 232–1245)

## 2019-06-10 ENCOUNTER — Other Ambulatory Visit: Payer: Self-pay | Admitting: Adult Health

## 2019-06-10 ENCOUNTER — Telehealth: Payer: Self-pay

## 2019-06-10 MED ORDER — ERGOCALCIFEROL 1.25 MG (50000 UT) PO CAPS: 50000.0000 [IU] | ORAL_CAPSULE | ORAL | 0 refills | Status: DC

## 2019-06-10 NOTE — Telephone Encounter (Signed)
Pt advised that vitamin d is low we send vitamin D to phar take once a week

## 2019-06-10 NOTE — Telephone Encounter (Signed)
-----   Message from Johnna Acosta, NP sent at 06/10/2019  9:10 AM EDT ----- I sent Vitamin D RX, she needs to take one pill weekly.  ----- Message ----- From: Nell Range Lab Results In Sent: 05/31/2019   5:41 AM EDT To: Johnna Acosta, NP

## 2019-06-10 NOTE — Progress Notes (Unsigned)
Sent vitamin D to pharmacy.  Will take once weekly for 12 weeks.

## 2019-07-07 ENCOUNTER — Telehealth: Payer: Self-pay

## 2019-07-07 NOTE — Telephone Encounter (Signed)
Confirmed appointment on 07/06/2021and screened for covid. klh 

## 2019-07-12 ENCOUNTER — Other Ambulatory Visit: Payer: Self-pay

## 2019-07-12 ENCOUNTER — Ambulatory Visit: Payer: Medicaid Other | Admitting: Adult Health

## 2019-07-12 VITALS — BP 108/58 | HR 66 | Temp 97.5°F | Resp 16 | Ht 63.0 in | Wt 188.0 lb

## 2019-07-12 DIAGNOSIS — K219 Gastro-esophageal reflux disease without esophagitis: Secondary | ICD-10-CM

## 2019-07-12 DIAGNOSIS — Z3490 Encounter for supervision of normal pregnancy, unspecified, unspecified trimester: Secondary | ICD-10-CM

## 2019-07-12 DIAGNOSIS — Z0001 Encounter for general adult medical examination with abnormal findings: Secondary | ICD-10-CM | POA: Diagnosis not present

## 2019-07-12 DIAGNOSIS — F17200 Nicotine dependence, unspecified, uncomplicated: Secondary | ICD-10-CM

## 2019-07-12 DIAGNOSIS — H6022 Malignant otitis externa, left ear: Secondary | ICD-10-CM

## 2019-07-12 DIAGNOSIS — B009 Herpesviral infection, unspecified: Secondary | ICD-10-CM | POA: Diagnosis not present

## 2019-07-12 DIAGNOSIS — R300 Dysuria: Secondary | ICD-10-CM

## 2019-07-12 LAB — POCT URINE PREGNANCY: Preg Test, Ur: NEGATIVE

## 2019-07-12 MED ORDER — NEOMYCIN-POLYMYXIN-HC 1 % OT SOLN: 3.0000 [drp] | Freq: Four times a day (QID) | OTIC | 0 refills | Status: DC

## 2019-07-12 NOTE — Progress Notes (Signed)
[facility] [place],  [address]  Internal MEDICINE  Office Visit Note  Patient Name: Grace [name]  [id number]  [id number]  Date of Service: 07/12/2019  Chief Complaint  Patient presents with   Annual Exam    ear drainage     HPI Pt is here for routine health maintenance examination.  She is a well appearing 35 you AA female. She has a history GERD and herpes.  She has not had a herpes breakout in some time.  She does have valtrex as needed.  She reports good management of GERD with pepcid. Seh has also had issues with constipation, however she is having regular bowel movements at this time thanks to Docusate.         She would like a pregnancy test because her period is late by almost 2 weeks. Negative today.     Current Medication: Outpatient Encounter Medications as of 07/12/2019  Medication Sig   DOK 100 MG capsule TAKE 1 CAPSULE(100 MG) BY MOUTH TWICE DAILY   famotidine (PEPCID) 20 MG tablet TAKE 1 TABLET(20 MG) BY MOUTH TWICE DAILY   fluticasone (FLONASE) 50 MCG/ACT nasal spray instill 2 sprays into each nostril once daily   valACYclovir (VALTREX) 1000 MG tablet Take 1 tablet (1,000 mg total) by mouth daily.   Vitamin D, Ergocalciferol, (DRISDOL) 1.25 MG (50000 UNIT) CAPS capsule TAKE 1 CAPSULE BY MOUTH 1 TIME A WEEK   lidocaine-hydrocortisone (ANAMANTLE) 3-1 % KIT Place 1 application rectally 2 (two) times daily.   No facility-administered encounter medications on file as of 07/12/2019.    Surgical History: Past Surgical History:  Procedure Laterality Date   Cesearen Secdtion N/A 01/05/2013    Medical History: Past Medical History:  Diagnosis Date   Anemia    BV (bacterial vaginosis)    Genital herpes    Gonorrhea    Trichomoniasis     Family History: Family History  Problem Relation Age of Onset   Lung cancer Maternal Grandmother    Lung cancer Maternal Grandfather       Review of Systems   Vital  Signs: BP (!) 108/58    Pulse 66    Temp (!) 97.5 F (36.4 C)    Resp 16    Ht '5\' 3"'  (1.6 m)    Wt 188 lb (85.3 kg)    SpO2 99%    BMI 33.30 kg/m    Physical Exam   LABS: Recent Results (from the past 2160 hour(s))  CBC with Differential/Platelet     Status: None   Collection Time: 05/30/19 11:08 AM  Result Value Ref Range   WBC 6.3 3.4 - 10.8 x10E3/uL   RBC 4.58 3.77 - 5.28 x10E6/uL   Hemoglobin 12.2 11.1 - 15.9 g/dL   Hematocrit 37.6 34.0 - 46.6 %   MCV 82 79 - 97 fL   MCH 26.6 26.6 - 33.0 pg   MCHC 32.4 31 - 35 g/dL   RDW 13.9 11.7 - 15.4 %   Platelets 355 150 - 450 x10E3/uL   Neutrophils 56 Not Estab. %   Lymphs 31 Not Estab. %   Monocytes 9 Not Estab. %   Eos 2 Not Estab. %   Basos 1 Not Estab. %   Neutrophils Absolute 3.6 1 - 7 x10E3/uL   Lymphocytes Absolute 1.9 0 - 3 x10E3/uL   Monocytes Absolute 0.6 0 - 0 x10E3/uL   EOS (ABSOLUTE) 0.1 0.0 - 0.4 x10E3/uL   Basophils Absolute 0.0 0 -  0 x10E3/uL   Immature Granulocytes 1 Not Estab. %   Immature Grans (Abs) 0.0 0.0 - 0.1 x10E3/uL  Lipid Panel With LDL/HDL Ratio     Status: None   Collection Time: 05/30/19 11:08 AM  Result Value Ref Range   Cholesterol, Total 146 100 - 199 mg/dL   Triglycerides 61 0 - 149 mg/dL   HDL 70 >39 mg/dL   VLDL Cholesterol Cal 13 5 - 40 mg/dL   LDL Chol Calc (NIH) 63 0 - 99 mg/dL   LDL/HDL Ratio 0.9 0.0 - 3.2 ratio    Comment:                                     LDL/HDL Ratio                                             Men  Women                               1/2 Avg.Risk  1.0    1.5                                   Avg.Risk  3.6    3.2                                2X Avg.Risk  6.2    5.0                                3X Avg.Risk  8.0    6.1   TSH     Status: None   Collection Time: 05/30/19 11:08 AM  Result Value Ref Range   TSH 0.796 0.450 - 4.500 uIU/mL  T4, free     Status: None   Collection Time: 05/30/19 11:08 AM  Result Value Ref Range   Free T4 1.10 0.82 - 1.77 ng/dL   Comprehensive metabolic panel     Status: None   Collection Time: 05/30/19 11:08 AM  Result Value Ref Range   Glucose 83 65 - 99 mg/dL   BUN 11 6 - 20 mg/dL   Creatinine, Ser 0.91 0.57 - 1.00 mg/dL   GFR calc non Af Amer 84 >59 mL/min/1.73   GFR calc Af Amer 97 >59 mL/min/1.73    Comment: **[facility] currently reports eGFR in compliance with the current**   recommendations of the [employer]. [facility] will   update reporting as new guidelines are published from the NKF-ASN   Task force.    BUN/Creatinine Ratio 12 9 - 23   Sodium 139 134 - 144 mmol/L   Potassium 4.8 3.5 - 5.2 mmol/L   Chloride 104 96 - 106 mmol/L   CO2 24 20 - 29 mmol/L   Calcium 9.6 8.7 - 10.2 mg/dL   Total Protein 6.4 6.0 - 8.5 g/dL   Albumin 4.3 3.8 - 4.8 g/dL   Globulin, Total 2.1 1.5 - 4.5 g/dL   Albumin/Globulin Ratio 2.0 1.2 - 2.2   Bilirubin Total <0.2 0.0 - 1.2 mg/dL  Alkaline Phosphatase 100 48 - 121 IU/L    Comment:               **Please note reference interval change**   AST 21 0 - 40 IU/L   ALT 22 0 - 32 IU/L  Vitamin D (25 hydroxy)     Status: Abnormal   Collection Time: 05/30/19 11:08 AM  Result Value Ref Range   Vit D, 25-Hydroxy 14.8 (L) 30.0 - 100.0 ng/mL    Comment: Vitamin D deficiency has been defined by the [place] practice guideline as a level of serum 25-OH vitamin D less than 20 ng/mL (1,2). The Endocrine Society went on to further define vitamin D insufficiency as a level between 21 and 29 ng/mL (2). 1. IOM (Institute of Medicine). 2010. Dietary reference    intakes for calcium and D. Page: The    [employer]. 2. Holick MF, Binkley Robbins, Bischoff-Ferrari HA, et al.    Evaluation, treatment, and prevention of vitamin D    deficiency: an Endocrine Society clinical practice    guideline. JCEM. 2011 Jul; 96(7):1911-30.   B12 and Folate Panel     Status: None   Collection Time: 05/30/19 11:08 AM  Result Value  Ref Range   Vitamin B-12 711 232 - 1,245 pg/mL   Folate 15.3 >3.0 ng/mL    Comment: A serum folate concentration of less than 3.1 ng/mL is considered to represent clinical deficiency.   Fe+TIBC+Fer     Status: Abnormal   Collection Time: 05/30/19 11:08 AM  Result Value Ref Range   Total Iron Binding Capacity 341 250 - 450 ug/dL   UIBC 298 131 - 425 ug/dL   Iron 43 27 - 159 ug/dL   Iron Saturation 13 (L) 15 - 55 %   Ferritin 29 15.0 - 150.0 ng/mL  POCT urine pregnancy     Status: None   Collection Time: 07/12/19 10:37 AM  Result Value Ref Range   Preg Test, Ur Negative Negative    Assessment/Plan: 1. Encounter for general adult medical examination with abnormal findings Up to date on PHM.  2. Gastroesophageal reflux disease without esophagitis Stable, continue current management.   3. Acute malignant otitis externa of left ear Advised patient to take entire course of antibiotics as prescribed in affected ear. Pt should return to clinic in 7-10 days if symptoms fail to improve or new symptoms develop.  - NEOMYCIN-POLYMYXIN-HYDROCORTISONE (CORTISPORIN) 1 % SOLN OTIC solution; Place 3 drops into the left ear 4 (four) times daily.  Dispense: 10 mL; Refill: 0  4. Herpes infection No recent outbreaks.    5. Nicotine dependence with current use Smoking cessation counseling: 1. Pt acknowledges the risks of long term smoking, she will try to quite smoking. 2. Options for different medications including nicotine products, chewing gum, patch etc, Wellbutrin and Chantix is discussed 3. Goal and date of compete cessation is discussed 4. Total time spent in smoking cessation is 15 min.   6. Pregnancy, unspecified gestational age Negative UPREG today.  - POCT urine pregnancy  7. Dysuria - Urinalysis, Routine w reflex microscopic  General Counseling: Marykathryn verbalizes understanding of the findings of todays visit and agrees with plan of treatment. I have discussed any further  diagnostic evaluation that may be needed or ordered today. We also reviewed her medications today. she has been encouraged to call the office with any questions or concerns that should arise related to todays visit.  Orders Placed This Encounter  Procedures   Urinalysis, Routine w reflex microscopic   POCT urine pregnancy    No orders of the defined types were placed in this encounter.   Time spent: 30 Minutes   This patient was seen by Orson Gear AGNP-C in Collaboration with Dr Lavera Guise as a part of collaborative care agreement    Kendell Bane AGNP-C Internal Medicine

## 2019-07-13 LAB — URINALYSIS, ROUTINE W REFLEX MICROSCOPIC
Bilirubin, UA: NEGATIVE
Glucose, UA: NEGATIVE
Leukocytes,UA: NEGATIVE
Nitrite, UA: NEGATIVE
Protein,UA: NEGATIVE
RBC, UA: NEGATIVE
Specific Gravity, UA: 1.029 (ref 1.005–1.030)
Urobilinogen, Ur: 0.2 mg/dL (ref 0.2–1.0)
pH, UA: 5.5 (ref 5.0–7.5)

## 2019-08-01 ENCOUNTER — Ambulatory Visit (LOCAL_COMMUNITY_HEALTH_CENTER): Payer: Medicaid Other | Admitting: Family Medicine

## 2019-08-01 ENCOUNTER — Other Ambulatory Visit: Payer: Self-pay

## 2019-08-01 ENCOUNTER — Encounter: Payer: Self-pay | Admitting: Family Medicine

## 2019-08-01 VITALS — BP 100/65 | Ht 63.0 in | Wt 189.4 lb

## 2019-08-01 DIAGNOSIS — Z01419 Encounter for gynecological examination (general) (routine) without abnormal findings: Secondary | ICD-10-CM

## 2019-08-01 DIAGNOSIS — Z3009 Encounter for other general counseling and advice on contraception: Secondary | ICD-10-CM

## 2019-08-01 DIAGNOSIS — Z113 Encounter for screening for infections with a predominantly sexual mode of transmission: Secondary | ICD-10-CM

## 2019-08-01 LAB — WET PREP FOR TRICH, YEAST, CLUE
Trichomonas Exam: NEGATIVE
Yeast Exam: NEGATIVE

## 2019-08-01 MED ORDER — THERA VITAL M PO TABS: 1.0000 | ORAL_TABLET | Freq: Every day | ORAL | 0 refills | Status: DC

## 2019-08-01 NOTE — Progress Notes (Signed)
[facility] [address] [place], [place] [address] Main Number: [phone]  Family Planning Visit- Initial Visit  Subjective:  Grace [name] is a 34 y.o.  G3P2003  being seen today for an initial well woman visit and to discuss family planning options. Patient reports they may or may not want a pregnancy in the next year.   Chief Complaint  Patient presents with   Contraception    Pt has Herpes genitalia and Chlamydia on their problem list.   HPI  Patient reports she is here for physical exam. Also had physical exam 2 wks ago 07/12/19 at [facility]. She does not use contraception, is open to pregnancy if it happens.  She has HSV, last outbreak 1-2 wks ago. She takes valtrex prn for this. Last week noticed spotting x1 day, outside of regular menstrual cycle, would like STI check.    Patient's last menstrual period was 07/14/2019 (exact date). Last sex: this morning BCM: nothing Pt desires EC? Pt declines  Last pap per pt/review of record: 12/25/2017: NIL, HPV not detected. Next due 12/2022 Last HIV test per pt/review of record: 02/2017 Last tetanus vaccine: 2006. Pt declines today  Last breast exam: >2 yrs ago Personal/family hx breast cancer? no  Patient reports 1 partner(s) in last year. Do they desire STI screening (if no, why not)? yes  Does the patient desire a pregnancy in the next year? maybe   34 y.o., Body mass index is 33.55 kg/m. - Is patient eligible for HA1C diabetes screening based on BMI and age >61?  no  HCV screening;       Has patient been screened once for HCV in the past?  no  No results found for: HCVAB      Does the patient have current drug use, have a partner with drug use, and/or has been incarcerated since last result? no If yes-- Screen for HCV through [facility]   Does the patient meet criteria for HBV testing? no Criteria:  -Household, sexual or needle sharing contact  with HBV -History of drug use -HIV positive -Those with known Hep C  PHQ-2 score: 0  See flowsheet for other program required questions.   Health Maintenance Due  Topic Date Due   COVID-19 Vaccine (1) Never done   TETANUS/TDAP  Never done    ROS 10 point review of systems is otherwise negative except as mentioned in HPI and listed below: See HPI  The following portions of the patient's history were reviewed and updated as appropriate: allergies, current medications, past family history, past medical history, past social history, past surgical history and problem list. Problem list updated.   See flowsheet for other program required questions.  Objective:   Vitals:   08/01/19 1110  BP: 100/65  Weight: 189 lb 6.4 oz (85.9 kg)  Height: 5\' 3"  (1.6 m)    Physical Exam Vitals and nursing note reviewed.  Constitutional:      Appearance: Normal appearance.  HENT:     Head: Normocephalic and atraumatic.     Mouth/Throat:     Mouth: Mucous membranes are moist.     Pharynx: Oropharynx is clear. No oropharyngeal exudate or posterior oropharyngeal erythema.  Eyes:     Conjunctiva/sclera: Conjunctivae normal.  Neck:     Thyroid: No thyroid mass, thyromegaly or thyroid tenderness.  Cardiovascular:     Rate and Rhythm: Normal rate and regular rhythm.     Pulses: Normal pulses.  Heart sounds: Normal heart sounds.  Pulmonary:     Effort: Pulmonary effort is normal.     Breath sounds: Normal breath sounds.  Chest:     Breasts:        Right: Normal. No swelling, mass, nipple discharge, skin change or tenderness.        Left: Normal. No swelling, mass, nipple discharge, skin change or tenderness.  Abdominal:     General: Abdomen is flat.     Palpations: There is no mass.     Tenderness: There is no abdominal tenderness. There is no rebound.  Genitourinary:    General: Normal vulva.     Exam position: Lithotomy position.     Pubic Area: No rash or pubic lice.       Labia:        Right: No rash or lesion.        Left: No rash or lesion.      Vagina: Normal. No vaginal erythema, bleeding or lesions. Vaginal discharge: white, creamy, ph<4.5    Cervix: No cervical motion tenderness, discharge, friability, lesion or erythema.     Uterus: Normal.      Adnexa: Right adnexa normal and left adnexa normal.     Rectum: Normal.  Lymphadenopathy:     Head:     Right side of head: No preauricular or posterior auricular adenopathy.     Left side of head: No preauricular or posterior auricular adenopathy.     Cervical: No cervical adenopathy.     Upper Body:     Right upper body: No supraclavicular or axillary adenopathy.     Left upper body: No supraclavicular or axillary adenopathy.     Lower Body: No right inguinal adenopathy. No left inguinal adenopathy.  Skin:    General: Skin is warm and dry.     Findings: No rash.  Neurological:     Mental Status: She is alert and oriented to person, place, and time.      Assessment and Plan:  Grace [name] is a 34 y.o. female presenting to the [facility] Department for an initial well woman exam/family planning visit.  Contraception counseling: Reviewed all forms of birth control options in the tiered based approach. available including abstinence; over the counter/barrier methods; hormonal contraceptive medication including pill, patch, ring, injection,contraceptive implant, ECP; hormonal and nonhormonal IUDs; permanent sterilization options including vasectomy and the various tubal sterilization modalities. Risks, benefits, and typical effectiveness rates were reviewed.  Questions were answered.  Written information was also given to the patient to review.  Patient desires nothing. She will follow up in  1 year for surveillance.  She was told to call with any further questions, or with any concerns about this method of contraception.  Emphasized use of condoms 100% of the time for STI  prevention.  Emergency Contraception: Pt was offered ECP. ECP was not accepted by pt.    1. Well woman exam -BCM: pt declines BCM, is open to pregnancy. Preconception counseling given - advised taking PNV w/folic acid, encouraged healthy diet and lifestyle. -Pap: due 12/2022 -CBE: done today. "Active FYIs" info up to date. Recommended screening mammograms beginning at age 32 -STI screening: done tdoay -PHQ-2 results 0 -Hepatitis B/C screening: pt qualifies and accepts hep C screening today -A1c screening: n/a -Re: hx vaginal spotting x1: no bleeding or abnormalities on exam today, advised to RTC if problem persists/worsens. Advised home pregnancy test if period cycle >28 days  2. Screening examination for  venereal disease -Pt with symptoms. Screenings today as below. Treat wet prep per standing order. -Patient does meet criteria for HepC Screening. Accepts this screenings. -Counseled on warning s/sx and when to seek care. Recommended condom use with all sex and discussed importance of condom use for STI prevention. - Syphilis Serology, [facility] - HIV/HCV [facility] - Chlamydia/Gonorrhea [facility] - WET PREP FOR TRICH, YEAST, CLUE     Return in about 1 year (around 07/31/2020) for yearly wellness exam.  Future Appointments  Date Time Provider Department Center  07/17/2020  9:00 AM [place], Coralee [place], NP [facility] None    Ann Held, PA-C

## 2019-08-01 NOTE — Progress Notes (Signed)
In for physical; declines BCM; desires HIV/RPR testing Sharlette Dense, RN  Wet prep reviewed by provider-no Tx indicated Sharlette Dense, RN

## 2019-08-05 ENCOUNTER — Encounter: Payer: Self-pay | Admitting: Family Medicine

## 2019-08-05 LAB — HM HEPATITIS C SCREENING LAB: HM Hepatitis Screen: NEGATIVE

## 2019-08-05 LAB — HM HIV SCREENING LAB: HM HIV Screening: NEGATIVE

## 2019-08-10 ENCOUNTER — Encounter: Payer: Self-pay | Admitting: Adult Health

## 2019-08-10 ENCOUNTER — Ambulatory Visit: Payer: Medicaid Other | Admitting: Adult Health

## 2019-08-10 ENCOUNTER — Other Ambulatory Visit: Payer: Self-pay

## 2019-08-10 VITALS — BP 114/74 | HR 77 | Temp 97.5°F | Resp 16 | Ht 63.0 in | Wt 190.4 lb

## 2019-08-10 DIAGNOSIS — K219 Gastro-esophageal reflux disease without esophagitis: Secondary | ICD-10-CM | POA: Diagnosis not present

## 2019-08-10 DIAGNOSIS — F17200 Nicotine dependence, unspecified, uncomplicated: Secondary | ICD-10-CM

## 2019-08-10 DIAGNOSIS — R21 Rash and other nonspecific skin eruption: Secondary | ICD-10-CM | POA: Diagnosis not present

## 2019-08-10 MED ORDER — TRIAMCINOLONE ACETONIDE 0.5 % EX OINT: 1.0000 "application " | TOPICAL_OINTMENT | Freq: Two times a day (BID) | CUTANEOUS | 0 refills | Status: DC

## 2019-08-10 NOTE — Progress Notes (Signed)
[facility] [address] [place], [place] [address]  Internal MEDICINE  Office Visit Note  Patient Name: Grace [name]  [id number]  [id number]  Date of Service: 08/16/2019  Chief Complaint  Patient presents with  . Acute Visit  . Quality Metric Gaps    tdap  . Rash    neck, under breast for about a week      HPI Pt is here for a sick visit. She reports one week of flat rash on her neck and under her breasts.  She has been using coco butter with some relief.  She denies any itching, she reports it feels like a chaffing feeling. The skin is intact, and she denies any bleeding, or raised rash.     Current Medication:  Outpatient Encounter Medications as of 08/10/2019  Medication Sig  . DOK 100 MG capsule TAKE 1 CAPSULE(100 MG) BY MOUTH TWICE DAILY  . famotidine (PEPCID) 20 MG tablet TAKE 1 TABLET(20 MG) BY MOUTH TWICE DAILY  . fluticasone (FLONASE) 50 MCG/ACT nasal spray instill 2 sprays into each nostril once daily  . Multiple Vitamins-Minerals (MULTIVITAMIN) tablet Take 1 tablet by mouth daily.  Marland Kitchen triamcinolone ointment (KENALOG) 0.5 % Apply 1 application topically 2 (two) times daily.  . valACYclovir (VALTREX) 1000 MG tablet Take 1 tablet (1,000 mg total) by mouth daily.  . Vitamin D, Ergocalciferol, (DRISDOL) 1.25 MG (50000 UNIT) CAPS capsule TAKE 1 CAPSULE BY MOUTH 1 TIME A WEEK   No facility-administered encounter medications on file as of 08/10/2019.      Medical History: Past Medical History:  Diagnosis Date  . Anemia   . BV (bacterial vaginosis)   . Genital herpes   . Gonorrhea   . Trichomoniasis      Vital Signs: BP 114/74   Pulse 77   Temp (!) 97.5 F (36.4 C)   Resp 16   Ht 5\' 3"  (1.6 m)   Wt 190 lb 6.4 oz (86.4 kg)   LMP 07/14/2019 (Exact Date)   SpO2 98%   BMI 33.73 kg/m    Review of Systems  Constitutional: Negative for chills, fatigue and unexpected weight change.  HENT: Negative for congestion, rhinorrhea, sneezing and  sore throat.   Eyes: Negative for photophobia, pain and redness.  Respiratory: Negative for cough, chest tightness and shortness of breath.   Cardiovascular: Negative for chest pain and palpitations.  Gastrointestinal: Negative for abdominal pain, constipation, diarrhea, nausea and vomiting.  Endocrine: Negative.   Genitourinary: Negative for dysuria and frequency.  Musculoskeletal: Negative for arthralgias, back pain, joint swelling and neck pain.  Skin: Negative for rash.  Allergic/Immunologic: Negative.   Neurological: Negative for tremors and numbness.  Hematological: Negative for adenopathy. Does not bruise/bleed easily.  Psychiatric/Behavioral: Negative for behavioral problems and sleep disturbance. The patient is not nervous/anxious.     Physical Exam Vitals and nursing note reviewed.  Constitutional:      General: She is not in acute distress.    Appearance: She is well-developed. She is not diaphoretic.  HENT:     Head: Normocephalic and atraumatic.     Mouth/Throat:     Pharynx: No oropharyngeal exudate.  Eyes:     Pupils: Pupils are equal, round, and reactive to light.  Neck:     Thyroid: No thyromegaly.     Vascular: No JVD.     Trachea: No tracheal deviation.  Cardiovascular:     Rate and Rhythm: Normal rate and regular rhythm.     Heart  sounds: Normal heart sounds. No murmur heard.  No friction rub. No gallop.   Pulmonary:     Effort: Pulmonary effort is normal. No respiratory distress.     Breath sounds: Normal breath sounds. No wheezing or rales.  Chest:     Chest wall: No tenderness.  Abdominal:     Palpations: Abdomen is soft.     Tenderness: There is no abdominal tenderness. There is no guarding.  Musculoskeletal:        General: Normal range of motion.     Cervical back: Normal range of motion and neck supple.  Lymphadenopathy:     Cervical: No cervical adenopathy.  Skin:    General: Skin is warm and dry.  Neurological:     Mental Status: She is  alert and oriented to person, place, and time.     Cranial Nerves: No cranial nerve deficit.  Psychiatric:        Behavior: Behavior normal.        Thought Content: Thought content normal.        Judgment: Judgment normal.    Assessment/Plan: 1. Rash and nonspecific skin eruption Use kenalog as prescribed, and follow up if no improvement.  - triamcinolone ointment (KENALOG) 0.5 %; Apply 1 application topically 2 (two) times daily.  Dispense: 30 g; Refill: 0  2. Gastroesophageal reflux disease without esophagitis Controlled, continue current therapy  3. Nicotine dependence with current use Smoking cessation counseling: 1. Pt acknowledges the risks of long term smoking, she will try to quite smoking. 2. Options for different medications including nicotine products, chewing gum, patch etc, Wellbutrin and Chantix is discussed 3. Goal and date of compete cessation is discussed 4. Total time spent in smoking cessation is 15 min.   General Counseling: Marigny verbalizes understanding of the findings of todays visit and agrees with plan of treatment. I have discussed any further diagnostic evaluation that may be needed or ordered today. We also reviewed her medications today. she has been encouraged to call the office with any questions or concerns that should arise related to todays visit.   No orders of the defined types were placed in this encounter.   Meds ordered this encounter  Medications  . triamcinolone ointment (KENALOG) 0.5 %    Sig: Apply 1 application topically 2 (two) times daily.    Dispense:  30 g    Refill:  0    Time spent: 25 Minutes  This patient was seen by Blima Ledger AGNP-C in Collaboration with Dr Lyndon Code as a part of collaborative care agreement.  Johnna Acosta AGNP-C Internal Medicine

## 2019-09-02 ENCOUNTER — Other Ambulatory Visit: Payer: Self-pay | Admitting: Adult Health

## 2019-10-11 ENCOUNTER — Ambulatory Visit: Payer: Medicaid Other

## 2019-10-11 ENCOUNTER — Encounter: Payer: Self-pay | Admitting: Internal Medicine

## 2019-10-11 ENCOUNTER — Ambulatory Visit: Payer: Medicaid Other | Admitting: Internal Medicine

## 2019-10-11 ENCOUNTER — Other Ambulatory Visit: Payer: Self-pay

## 2019-10-11 VITALS — BP 98/68 | HR 92 | Temp 98.7°F | Resp 16 | Ht 63.0 in | Wt 194.0 lb

## 2019-10-11 DIAGNOSIS — R300 Dysuria: Secondary | ICD-10-CM | POA: Diagnosis not present

## 2019-10-11 DIAGNOSIS — B3731 Acute candidiasis of vulva and vagina: Secondary | ICD-10-CM

## 2019-10-11 DIAGNOSIS — B373 Candidiasis of vulva and vagina: Secondary | ICD-10-CM

## 2019-10-11 LAB — POCT URINALYSIS DIPSTICK
Bilirubin, UA: NEGATIVE
Glucose, UA: NEGATIVE
Ketones, UA: NEGATIVE
Leukocytes, UA: NEGATIVE
Nitrite, UA: NEGATIVE
Protein, UA: NEGATIVE
Spec Grav, UA: 1.025 (ref 1.010–1.025)
Urobilinogen, UA: 0.2 E.U./dL
pH, UA: 5 (ref 5.0–8.0)

## 2019-10-11 MED ORDER — FLUCONAZOLE 150 MG PO TABS: ORAL_TABLET | ORAL | 1 refills | Status: DC

## 2019-10-14 NOTE — Progress Notes (Signed)
[facility] [address] [place], [place] [address]  Internal MEDICINE  Office Visit Note  Patient Name: Grace [name]  [id number]  [id number]  Date of Service: 10/14/2019  Chief Complaint  Patient presents with  . Vaginitis    cloudy discharge started saturday  . controlled substance form    acknowledged    HPI  Pt is here with ongoing complaints of vaginal discharge, she was seen in urgent care STD screen is negative multiple times, she is in monogomous relationship, denies any burning or odor    Current Medication: Outpatient Encounter Medications as of 10/11/2019  Medication Sig  . DOK 100 MG capsule TAKE 1 CAPSULE(100 MG) BY MOUTH TWICE DAILY  . famotidine (PEPCID) 20 MG tablet TAKE 1 TABLET(20 MG) BY MOUTH TWICE DAILY  . fluticasone (FLONASE) 50 MCG/ACT nasal spray instill 2 sprays into each nostril once daily  . triamcinolone ointment (KENALOG) 0.5 % Apply 1 application topically 2 (two) times daily.  . valACYclovir (VALTREX) 1000 MG tablet TAKE 1 TABLET(1000 MG) BY MOUTH DAILY  . Vitamin D, Ergocalciferol, (DRISDOL) 1.25 MG (50000 UNIT) CAPS capsule TAKE 1 CAPSULE BY MOUTH 1 TIME A WEEK  . fluconazole (DIFLUCAN) 150 MG tablet Take one tab po qd x 3 days and then once a week x 2 weeks  . [DISCONTINUED] Multiple Vitamins-Minerals (MULTIVITAMIN) tablet Take 1 tablet by mouth daily. (Patient not taking: Reported on 10/11/2019)   No facility-administered encounter medications on file as of 10/11/2019.    Surgical History: Past Surgical History:  Procedure Laterality Date  . Cesearen Secdtion N/A 01/05/2013    Medical History: Past Medical History:  Diagnosis Date  . Anemia   . BV (bacterial vaginosis)   . Genital herpes   . Gonorrhea   . Trichomoniasis     Family History: Family History  Problem Relation Age of Onset  . Lung cancer Maternal Grandmother   . Cancer Maternal Grandmother   . Lung cancer Maternal Grandfather   . Cancer Maternal  Grandfather   . Cancer Paternal Grandfather   . Cancer Paternal Grandmother   . Diabetes Mother   . Breast cancer Neg Hx     Social History   Socioeconomic History  . Marital status: Single    Spouse name: Not on file  . Number of children: 3  . Years of education: 71  . Highest education level: Not on file  Occupational History  . Not on file  Tobacco Use  . Smoking status: Current Every Day Smoker    Packs/day: 1.00    Types: Cigars  . Smokeless tobacco: Never Used  . Tobacco comment: doesn't want medications to help stop smoking  Vaping Use  . Vaping Use: Former  Substance and Sexual Activity  . Alcohol use: Yes    Comment: occ  . Drug use: Yes    Types: Marijuana  . Sexual activity: Yes    Birth control/protection: None  Other Topics Concern  . Not on file  Social History Narrative  . Not on file   Social Determinants of Health   Financial Resource Strain:   . Difficulty of Paying Living Expenses: Not on file  Food Insecurity:   . Worried About Programme researcher, broadcasting/film/video in the Last Year: Not on file  . Ran Out of Food in the Last Year: Not on file  Transportation Needs:   . Lack of Transportation (Medical): Not on file  . Lack of Transportation (Non-Medical): Not on file  Physical  Activity:   . Days of Exercise per Week: Not on file  . Minutes of Exercise per Session: Not on file  Stress:   . Feeling of Stress : Not on file  Social Connections:   . Frequency of Communication with Friends and Family: Not on file  . Frequency of Social Gatherings with Friends and Family: Not on file  . Attends Religious Services: Not on file  . Active Member of Clubs or Organizations: Not on file  . Attends Banker Meetings: Not on file  . Marital Status: Not on file  Intimate Partner Violence: Not At Risk  . Fear of Current or Ex-Partner: No  . Emotionally Abused: No  . Physically Abused: No  . Sexually Abused: No      Review of Systems  Constitutional:  Negative for chills.  Respiratory: Negative.   Cardiovascular: Negative.   Gastrointestinal: Negative.   Genitourinary: Positive for dysuria and vaginal discharge. Negative for genital sores.  Neurological: Negative.   Hematological: Negative.     Vital Signs: BP 98/68   Pulse 92   Temp 98.7 F (37.1 C)   Resp 16   Ht 5\' 3"  (1.6 m)   Wt 194 lb (88 kg)   SpO2 98%   BMI 34.37 kg/m    Physical Exam Constitutional:      Appearance: Normal appearance.  Eyes:     Extraocular Movements: Extraocular movements intact.     Pupils: Pupils are equal, round, and reactive to light.  Cardiovascular:     Rate and Rhythm: Normal rate and regular rhythm.  Skin:    General: Skin is warm.  Neurological:     General: No focal deficit present.     Mental Status: She is alert.    Assessment/Plan: 1. Candida vaginitis Start suppressive therapy with diflucan, avoid excessive abx use, stressed upon good hygiene  - fluconazole (DIFLUCAN) 150 MG tablet; Take one tab po qd x 3 days and then once a week x 2 weeks  Dispense: 5 tablet; Refill: 1  2. Dysuria Urine dipstick is negative, wills end for cx  - POCT Urinalysis Dipstick - fluconazole (DIFLUCAN) 150 MG tablet; Take one tab po qd x 3 days and then once a week x 2 weeks  Dispense: 5 tablet; Refill: 1 - CULTURE, URINE COMPREHENSIVE  General Counseling: Averly verbalizes understanding of the findings of todays visit and agrees with plan of treatment. I have discussed any further diagnostic evaluation that may be needed or ordered today. We also reviewed her medications today. she has been encouraged to call the office with any questions or concerns that should arise related to todays visit.    Orders Placed This Encounter  Procedures  . CULTURE, URINE COMPREHENSIVE  . POCT Urinalysis Dipstick    Meds ordered this encounter  Medications  . fluconazole (DIFLUCAN) 150 MG tablet    Sig: Take one tab po qd x 3 days and then once a week x  2 weeks    Dispense:  5 tablet    Refill:  1    Total time spent: Time spent includes review of chart, medications, test results, and follow up plan with the patient.      Dr Internal medicine

## 2019-10-16 LAB — CULTURE, URINE COMPREHENSIVE

## 2019-11-01 ENCOUNTER — Encounter: Payer: Self-pay | Admitting: Internal Medicine

## 2019-11-17 ENCOUNTER — Ambulatory Visit: Payer: Medicaid Other | Admitting: Hospice and Palliative Medicine

## 2019-12-05 ENCOUNTER — Ambulatory Visit: Payer: Medicaid Other | Admitting: Hospice and Palliative Medicine

## 2019-12-05 ENCOUNTER — Other Ambulatory Visit: Payer: Self-pay

## 2019-12-05 ENCOUNTER — Encounter: Payer: Self-pay | Admitting: Hospice and Palliative Medicine

## 2019-12-05 DIAGNOSIS — B009 Herpesviral infection, unspecified: Secondary | ICD-10-CM

## 2019-12-05 DIAGNOSIS — Z8616 Personal history of COVID-19: Secondary | ICD-10-CM | POA: Diagnosis not present

## 2019-12-05 DIAGNOSIS — R21 Rash and other nonspecific skin eruption: Secondary | ICD-10-CM | POA: Diagnosis not present

## 2019-12-05 NOTE — Progress Notes (Signed)
[facility] [address] [place], [place] [address]  Internal MEDICINE  Office Visit Note  Patient Name: Grace [name]  [id number]  [id number]  Date of Service: 12/06/2019  Chief Complaint  Patient presents with  . Follow-up    possible dermotologist referral, light rashes on hands for a few months, not getting worse but hasnt gone away, no pain itching or discomfort with rash  . Anemia  . 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--she is not vaccinated at this time, on the fence about getting vaccinated, thinks she may have been infected with COVID19 earlier this year, would consider being immunized if she is negative for antibodies  Complaining today of bilateral rash on her hands--has noticed them for the last several days--does wear gloves all day at her job, washes her hands frequently, reports mild irritation and itching--rash in flat and non-raised, no discoloration or redness, skin intact, no open areas  Hx of herpes, currently taking valtrex-Had back to back outbreaks last month--stress level was high   Current Medication: Outpatient Encounter Medications as of 12/05/2019  Medication Sig  . DOK 100 MG capsule TAKE 1 CAPSULE(100 MG) BY MOUTH TWICE DAILY  . famotidine (PEPCID) 20 MG tablet TAKE 1 TABLET(20 MG) BY MOUTH TWICE DAILY  . fluconazole (DIFLUCAN) 150 MG tablet Take one tab po qd x 3 days and then once a week x 2 weeks  . fluticasone (FLONASE) 50 MCG/ACT nasal spray instill 2 sprays into each nostril once daily  . triamcinolone ointment (KENALOG) 0.5 % Apply 1 application topically 2 (two) times  daily.  . valACYclovir (VALTREX) 1000 MG tablet TAKE 1 TABLET(1000 MG) BY MOUTH DAILY  . Vitamin D, Ergocalciferol, (DRISDOL) 1.25 MG (50000 UNIT) CAPS capsule TAKE 1 CAPSULE BY MOUTH 1 TIME A WEEK  . [DISCONTINUED] valACYclovir (VALTREX) 1000 MG tablet TAKE 1 TABLET(1000 MG) BY MOUTH DAILY   No facility-administered encounter medications on file as of 12/05/2019.    Surgical History: Past Surgical History:  Procedure Laterality Date  . Cesearen Secdtion N/A 01/05/2013    Medical History: Past Medical History:  Diagnosis Date  . Anemia   . BV (bacterial vaginosis)   . Genital herpes   . Gonorrhea   . Trichomoniasis     Family History: Family History  Problem Relation Age of Onset  . Lung cancer Maternal Grandmother   . Cancer Maternal Grandmother   . Lung cancer Maternal Grandfather   . Cancer Maternal Grandfather   . Cancer Paternal Grandfather   . Cancer Paternal Grandmother   . Diabetes Mother   . Breast cancer Neg Hx     Social History   Socioeconomic History  . Marital status: Single    Spouse name: Not on file  . Number of children: 3  . Years of education: 59  . Highest education level: Not on file  Occupational History  . Not on file  Tobacco Use  . Smoking status: Current Every Day Smoker    Packs/day: 1.00    Types: Cigars  . Smokeless tobacco: Never Used  . Tobacco comment: doesn't want medications to help stop smoking  Vaping  Use  . Vaping Use: Former  Substance and Sexual Activity  . Alcohol use: Yes    Comment: occ  . Drug use: Yes    Types: Marijuana  . Sexual activity: Yes    Birth control/protection: None  Other Topics Concern  . Not on file  Social History Narrative  . Not on file   Social Determinants of Health   Financial Resource Strain:   . Difficulty of Paying Living Expenses: Not on file  Food Insecurity:   . Worried About Programme researcher, broadcasting/film/video in the Last Year: Not on file  . Ran Out of Food in the Last Year: Not on file   Transportation Needs:   . Lack of Transportation (Medical): Not on file  . Lack of Transportation (Non-Medical): Not on file  Physical Activity:   . Days of Exercise per Week: Not on file  . Minutes of Exercise per Session: Not on file  Stress:   . Feeling of Stress : Not on file  Social Connections:   . Frequency of Communication with Friends and Family: Not on file  . Frequency of Social Gatherings with Friends and Family: Not on file  . Attends Religious Services: Not on file  . Active Member of Clubs or Organizations: Not on file  . Attends Banker Meetings: Not on file  . Marital Status: Not on file  Intimate Partner Violence: Not At Risk  . Fear of Current or Ex-Partner: No  . Emotionally Abused: No  . Physically Abused: No  . Sexually Abused: No    Review of Systems  Constitutional: Negative for chills, diaphoresis and fatigue.  HENT: Negative for ear pain, postnasal drip and sinus pressure.   Eyes: Negative for photophobia, discharge, redness, itching and visual disturbance.  Respiratory: Negative for cough, shortness of breath and wheezing.   Cardiovascular: Negative for chest pain, palpitations and leg swelling.  Gastrointestinal: Negative for abdominal pain, constipation, diarrhea, nausea and vomiting.  Genitourinary: Negative for dysuria and flank pain.  Musculoskeletal: Negative for arthralgias, back pain, gait problem and neck pain.  Skin: Positive for rash. Negative for color change.  Allergic/Immunologic: Negative for environmental allergies and food allergies.  Neurological: Negative for dizziness and headaches.  Hematological: Does not bruise/bleed easily.  Psychiatric/Behavioral: Negative for agitation, behavioral problems (depression) and hallucinations.    Vital Signs: BP 120/76   Pulse 71   Temp (!) 97.1 F (36.2 C)   Ht 5\' 3"  (1.6 m)   Wt 195 lb 9.6 oz (88.7 kg)   SpO2 99%   BMI 34.65 kg/m    Physical Exam Vitals reviewed.   Constitutional:      Appearance: Normal appearance. She is obese.  Cardiovascular:     Rate and Rhythm: Normal rate and regular rhythm.     Pulses: Normal pulses.     Heart sounds: Normal heart sounds.  Pulmonary:     Effort: Pulmonary effort is normal.     Breath sounds: Normal breath sounds.  Abdominal:     General: Abdomen is flat.     Palpations: Abdomen is soft.  Musculoskeletal:        General: Normal range of motion.     Cervical back: Normal range of motion.  Skin:    Comments: Rash to bilateral hands, small areas of discoloration, barely visible, on extensor surface of hands, no evidence of irritation or lichenification  Neurological:     General: No focal deficit present.     Mental Status: She  is alert and oriented to person, place, and time. Mental status is at baseline.  Psychiatric:        Mood and Affect: Mood normal.        Behavior: Behavior normal.        Thought Content: Thought content normal.    Assessment/Plan: 1. Rash and nonspecific skin eruption Discussed rash likely related to gloves and frequent hand washing, encouraged to apply fragrance and dye free lotion to hands multiple times during the day to avoid further irritation At this time not appropriate for dermatology referral  2. History of 2019 novel coronavirus disease (COVID-19) Will review antibody status, highly recommend vaccination again COVID19 - SARS-CoV-2 Antibody, IgM  3. Herpes infection Continue with suppressive therapy at this time, if break outs continue to be frequent may need to consider adjusting therapy - valACYclovir (VALTREX) 1000 MG tablet; TAKE 1 TABLET(1000 MG) BY MOUTH DAILY  Dispense: 30 tablet; Refill: 5  General Counseling: Darshana verbalizes understanding of the findings of todays visit and agrees with plan of treatment. I have discussed any further diagnostic evaluation that may be needed or ordered today. We also reviewed her medications today. she has been encouraged  to call the office with any questions or concerns that should arise related to todays visit.    Orders Placed This Encounter  Procedures  . SARS-CoV-2 Antibody, IgM    Meds ordered this encounter  Medications  . valACYclovir (VALTREX) 1000 MG tablet    Sig: TAKE 1 TABLET(1000 MG) BY MOUTH DAILY    Dispense:  30 tablet    Refill:  5    Time spent: 30 Minutes Time spent includes review of chart, medications, test results and follow-up plan with the patient.  This patient was seen by Leeanne Deed AGNP-C in Collaboration with Dr Lyndon Code as a part of collaborative care agreement     Lubertha Basque. Contina Strain AGNP-C Internal medicine

## 2019-12-06 ENCOUNTER — Encounter: Payer: Self-pay | Admitting: Hospice and Palliative Medicine

## 2019-12-06 MED ORDER — VALACYCLOVIR HCL 1 G PO TABS: ORAL_TABLET | ORAL | 5 refills | Status: DC

## 2020-01-02 ENCOUNTER — Ambulatory Visit: Payer: Medicaid Other | Admitting: Nurse Practitioner

## 2020-01-02 ENCOUNTER — Encounter: Payer: Self-pay | Admitting: Nurse Practitioner

## 2020-01-02 VITALS — Resp 16 | Ht 63.0 in | Wt 195.0 lb

## 2020-01-02 DIAGNOSIS — J069 Acute upper respiratory infection, unspecified: Secondary | ICD-10-CM

## 2020-01-02 MED ORDER — AZITHROMYCIN 250 MG PO TABS: ORAL_TABLET | ORAL | 0 refills | Status: DC

## 2020-01-02 NOTE — Progress Notes (Signed)
[facility] [address] [place], [place] [address]  Internal MEDICINE  Telephone Visit  Patient Name: Grace [name]  [id number]  [id number]  Date of Service: 01/02/2020  I connected with the patient at 1:54pm by telephone and verified the patients identity using two identifiers.   I discussed the limitations, risks, security and privacy concerns of performing an evaluation and management service by telephone and the availability of in person appointments. I also discussed with the patient that there may be a patient responsible charge related to the service.  The patient expressed understanding and agrees to proceed.    Chief Complaint  Patient presents with  . telephone assesment    Did not get a covid test  . Telephone Screen    The patient has been contacted via telephone for follow up visit due to concerns for spread of novel coronavirus. The patient presents for acute visit. She states that she has fever, chills, headache, and cough. She is having increased fatigue. States that hurts her head when she coughs. States that symptoms started yesterday. States that symptoms are worse at night and have been getting worse since they started yesterday. Has been taking some tylenol. This did help and she was able to rest better after taking the tylenol. She is unsure about exposure to COVID 19 or influenza. The holidays just past and she spent much of her time shopping with friends and family. Unsure of what she ay have been exposed to.       Current Medication: Outpatient Encounter Medications as of 01/02/2020  Medication Sig  . azithromycin (ZITHROMAX) 250 MG tablet z-pack - take as directed for 5 days  . DOK 100 MG capsule TAKE 1 CAPSULE(100 MG) BY MOUTH TWICE DAILY  . famotidine (PEPCID) 20 MG tablet TAKE 1 TABLET(20 MG) BY MOUTH TWICE DAILY  . fluconazole (DIFLUCAN) 150 MG tablet Take one tab po qd x 3 days and then once a week x 2 weeks  . fluticasone (FLONASE) 50  MCG/ACT nasal spray instill 2 sprays into each nostril once daily  . triamcinolone ointment (KENALOG) 0.5 % Apply 1 application topically 2 (two) times daily.  . valACYclovir (VALTREX) 1000 MG tablet TAKE 1 TABLET(1000 MG) BY MOUTH DAILY  . Vitamin D, Ergocalciferol, (DRISDOL) 1.25 MG (50000 UNIT) CAPS capsule TAKE 1 CAPSULE BY MOUTH 1 TIME A WEEK   No facility-administered encounter medications on file as of 01/02/2020.    Surgical History: Past Surgical History:  Procedure Laterality Date  . Cesearen Secdtion N/A 01/05/2013    Medical History: Past Medical History:  Diagnosis Date  . Anemia   . BV (bacterial vaginosis)   . Genital herpes   . Gonorrhea   . Trichomoniasis     Family History: Family History  Problem Relation Age of Onset  . Lung cancer Maternal Grandmother   . Cancer Maternal Grandmother   . Lung cancer Maternal Grandfather   . Cancer Maternal Grandfather   . Cancer Paternal Grandfather   . Cancer Paternal Grandmother   . Diabetes Mother   . Breast cancer Neg Hx     Social History   Socioeconomic History  . Marital status: Single    Spouse name: Not on file  . Number of children: 3  . Years of education: 78  . Highest education level: Not on file  Occupational History  . Not on file  Tobacco Use  . Smoking status: Current Every Day Smoker    Packs/day: 1.00  Types: Cigars  . Smokeless tobacco: Never Used  . Tobacco comment: doesn't want medications to help stop smoking  Vaping Use  . Vaping Use: Former  Substance and Sexual Activity  . Alcohol use: Yes    Comment: occ  . Drug use: Yes    Types: Marijuana  . Sexual activity: Yes    Birth control/protection: None  Other Topics Concern  . Not on file  Social History Narrative  . Not on file   Social Determinants of Health   Financial Resource Strain: Not on file  Food Insecurity: Not on file  Transportation Needs: Not on file  Physical Activity: Not on file  Stress: Not on file   Social Connections: Not on file  Intimate Partner Violence: Not At Risk  . Fear of Current or Ex-Partner: No  . Emotionally Abused: No  . Physically Abused: No  . Sexually Abused: No      Review of Systems  Constitutional: Positive for appetite change, chills, fatigue and fever.  HENT: Positive for congestion, postnasal drip, rhinorrhea, sinus pressure, sinus pain, sore throat and voice change. Negative for ear pain.   Respiratory: Positive for cough. Negative for wheezing.   Cardiovascular: Negative for chest pain and palpitations.  Gastrointestinal: Positive for nausea.  Musculoskeletal: Positive for myalgias.  Neurological: Positive for headaches.  Hematological: Positive for adenopathy.  Psychiatric/Behavioral: Negative.     Today's Vitals   01/02/20 1350  Resp: 16  Weight: 195 lb (88.5 kg)  Height:  (1.6 m)   Body mass index is 34.54 kg/m.  Observation/Objective:    The patient is alert and oriented. She is pleasant and answers all questions appropriately. Breathing is non-labored. She is in no acute distress at this time.  The patient is nasally congested and sounds as though she does not feel weil.    Assessment/Plan: 1. Acute upper respiratory infection Start z-pack. Take as directed for 5 days. Rest and increase fluids. Take OTC medications as needed and as indicated for acute symptoms. Recommend she do testing for COVID 19 as she is unsure about exposure to COVID 19. Encouraged her to add everyday supplementation with zinc, vitamin C, and vitamin d to help immune system function. She has agreed with this plan.  - azithromycin (ZITHROMAX) 250 MG tablet; z-pack - take as directed for 5 days  Dispense: 6 tablet; Refill: 0  General Counseling: Grace [name] verbalizes understanding of the findings of today's phone visit and agrees with plan of treatment. I have discussed any further diagnostic evaluation that may be needed or ordered today. We also reviewed her  medications today. she has been encouraged to call the office with any questions or concerns that should arise related to todays visit.     Person Under Monitoring Name: Grace [name]  Location: [address] [place] [address]   Infection Prevention Recommendations for Individuals Confirmed to have, or Being Evaluated for, 2019 Novel Coronavirus (COVID-19) Infection Who Receive Care at Home  Individuals who are confirmed to have, or are being evaluated for, COVID-19 should follow the prevention steps below until a healthcare provider or local or state health department says they can return to normal activities.  Stay home except to get medical care You should restrict activities outside your home, except for getting medical care. Do not go to work, school, or public areas, and do not use public transportation or taxis.  Call ahead before visiting your doctor Before your medical appointment, call the healthcare provider and  tell them that you have, or are being evaluated for, COVID-19 infection. This will help the healthcare provider's office take steps to keep other people from getting infected. Ask your healthcare provider to call the local or state health department.  Monitor your symptoms Seek prompt medical attention if your illness is worsening (e.g., difficulty breathing). Before going to your medical appointment, call the healthcare provider and tell them that you have, or are being evaluated for, COVID-19 infection. Ask your healthcare provider to call the local or state health department.  Wear a facemask You should wear a facemask that covers your nose and mouth when you are in the same room with other people and when you visit a healthcare provider. People who live with or visit you should also wear a facemask while they are in the same room with you.  Separate yourself from other people in your home As much as possible, you should stay in a different  room from other people in your home. Also, you should use a separate bathroom, if available.  Avoid sharing household items You should not share dishes, drinking glasses, cups, eating utensils, towels, bedding, or other items with other people in your home. After using these items, you should wash them thoroughly with soap and water.  Cover your coughs and sneezes Cover your mouth and nose with a tissue when you cough or sneeze, or you can cough or sneeze into your sleeve. Throw used tissues in a lined trash can, and immediately wash your hands with soap and water for at least 20 seconds or use an alcohol-based hand rub.  Wash your [employer] your hands often and thoroughly with soap and water for at least 20 seconds. You can use an alcohol-based hand sanitizer if soap and water are not available and if your hands are not visibly dirty. Avoid touching your eyes, nose, and mouth with unwashed hands.   Prevention Steps for Caregivers and Household Members of Individuals Confirmed to have, or Being Evaluated for, COVID-19 Infection Being Cared for in the Home  If you live with, or provide care at home for, a person confirmed to have, or being evaluated for, COVID-19 infection please follow these guidelines to prevent infection:  Follow healthcare provider's instructions Make sure that you understand and can help the patient follow any healthcare provider instructions for all care.  Provide for the patient's basic needs You should help the patient with basic needs in the home and provide support for getting groceries, prescriptions, and other personal needs.  Monitor the patient's symptoms If they are getting sicker, call his or her medical provider and tell them that the patient has, or is being evaluated for, COVID-19 infection. This will help the healthcare provider's office take steps to keep other people from getting infected. Ask the healthcare provider to call the local or state  health department.  Limit the number of people who have contact with the patient  If possible, have only one caregiver for the patient.  Other household members should stay in another home or place of residence. If this is not possible, they should stay  in another room, or be separated from the patient as much as possible. Use a separate bathroom, if available.  Restrict visitors who do not have an essential need to be in the home.  Keep older adults, very young children, and other sick people away from the patient Keep older adults, very young children, and those who have compromised immune systems or chronic  health conditions away from the patient. This includes people with chronic heart, lung, or kidney conditions, diabetes, and cancer.  Ensure good ventilation Make sure that shared spaces in the home have good air flow, such as from an air conditioner or an opened window, weather permitting.  Wash your hands often  Wash your hands often and thoroughly with soap and water for at least 20 seconds. You can use an alcohol based hand sanitizer if soap and water are not available and if your hands are not visibly dirty.  Avoid touching your eyes, nose, and mouth with unwashed hands.  Use disposable paper towels to dry your hands. If not available, use dedicated cloth towels and replace them when they become wet.  Wear a facemask and gloves  Wear a disposable facemask at all times in the room and gloves when you touch or have contact with the patient's blood, body fluids, and/or secretions or excretions, such as sweat, saliva, sputum, nasal mucus, vomit, urine, or feces.  Ensure the mask fits over your nose and mouth tightly, and do not touch it during use.  Throw out disposable facemasks and gloves after using them. Do not reuse.  Wash your hands immediately after removing your facemask and gloves.  If your personal clothing becomes contaminated, carefully remove clothing and  launder. Wash your hands after handling contaminated clothing.  Place all used disposable facemasks, gloves, and other waste in a lined container before disposing them with other household waste.  Remove gloves and wash your hands immediately after handling these items.  Do not share dishes, glasses, or other household items with the patient  Avoid sharing household items. You should not share dishes, drinking glasses, cups, eating utensils, towels, bedding, or other items with a patient who is confirmed to have, or being evaluated for, COVID-19 infection.  After the person uses these items, you should wash them thoroughly with soap and water.  Wash laundry thoroughly  Immediately remove and wash clothes or bedding that have blood, body fluids, and/or secretions or excretions, such as sweat, saliva, sputum, nasal mucus, vomit, urine, or feces, on them.  Wear gloves when handling laundry from the patient.  Read and follow directions on labels of laundry or clothing items and detergent. In general, wash and dry with the warmest temperatures recommended on the label.  Clean all areas the individual has used often  Clean all touchable surfaces, such as counters, tabletops, doorknobs, bathroom fixtures, toilets, phones, keyboards, tablets, and bedside tables, every day. Also, clean any surfaces that may have blood, body fluids, and/or secretions or excretions on them.  Wear gloves when cleaning surfaces the patient has come in contact with.  Use a diluted bleach solution (e.g., dilute bleach with 1 part bleach and 10 parts water) or a household disinfectant with a label that says EPA-registered for coronaviruses. To make a bleach solution at home, add 1 tablespoon of bleach to 1 quart (4 cups) of water. For a larger supply, add  cup of bleach to 1 gallon (16 cups) of water.  Read labels of cleaning products and follow recommendations provided on product labels. Labels contain instructions for  safe and effective use of the cleaning product including precautions you should take when applying the product, such as wearing gloves or eye protection and making sure you have good ventilation during use of the product.  Remove gloves and wash hands immediately after cleaning.  Monitor yourself for signs and symptoms of illness Caregivers and household members are considered  close contacts, should monitor their health, and will be asked to limit movement outside of the home to the extent possible. Follow the monitoring steps for close contacts listed on the symptom monitoring form.   ? If you have additional questions, contact your local health department or call the epidemiologist on call at [phone] (available 24/7). ? This guidance is subject to change. For the most up-to-date guidance from [facility], please refer to their website: [employer]hu  This patient was seen by Vincent Gros FNP Collaboration with Dr Lyndon Code as a part of collaborative care agreement  Meds ordered this encounter  Medications  . azithromycin (ZITHROMAX) 250 MG tablet    Sig: z-pack - take as directed for 5 days    Dispense:  6 tablet    Refill:  0    Order Specific Question:   Supervising Provider    Answer:   Lyndon Code [[id number]]    Time spent: 16 Minutes    Dr Lyndon Code Internal medicine

## 2020-01-04 ENCOUNTER — Other Ambulatory Visit: Payer: Self-pay | Admitting: Hospice and Palliative Medicine

## 2020-01-04 ENCOUNTER — Telehealth: Payer: Self-pay

## 2020-01-04 MED ORDER — HYDROCOD POLST-CPM POLST ER 10-8 MG/5ML PO SUER
5.0000 mL | Freq: Every evening | ORAL | 0 refills | Status: DC | PRN
Start: 2020-01-04 — End: 2020-03-26

## 2020-01-04 MED ORDER — ALBUTEROL SULFATE HFA 108 (90 BASE) MCG/ACT IN AERS: 2.0000 | INHALATION_SPRAY | Freq: Four times a day (QID) | RESPIRATORY_TRACT | 0 refills | Status: DC | PRN

## 2020-01-04 NOTE — Telephone Encounter (Signed)
Pt called stating that she had a covid test on Monday and it came back POSITIVE.  Pt c/o HA's, SOB, chest hurts, some cough, chills, and sweats.  No fever since first day of symptoms.  Pt has a pain in head when coughing. Ladona Ridgel advised for pt to rest , drink plenty of fluids, tylenol for pain.  Taylor sent in tussinex for nighttime cough and albuterol for for SOB.  Pt informed of meds sent to pharmacy.

## 2020-01-04 NOTE — Telephone Encounter (Signed)
Patient left voicemail at office stating she tested positive for Covid on Monday 01-02-20. Contacted patient this morning at 11:15 to check on her to see how she was feeling. She stated she is feeling much better, no fever since Monday, and no difficulties in breathing. Advised patient if her symptoms seemed to get worse to contact our office.

## 2020-01-16 ENCOUNTER — Other Ambulatory Visit: Payer: Self-pay

## 2020-01-16 ENCOUNTER — Ambulatory Visit: Payer: Medicaid Other | Admitting: Nurse Practitioner

## 2020-01-16 ENCOUNTER — Encounter: Payer: Self-pay | Admitting: Nurse Practitioner

## 2020-01-16 VITALS — BP 130/67 | HR 84 | Temp 97.8°F | Resp 16 | Ht 63.0 in | Wt 197.0 lb

## 2020-01-16 DIAGNOSIS — K219 Gastro-esophageal reflux disease without esophagitis: Secondary | ICD-10-CM | POA: Diagnosis not present

## 2020-01-16 DIAGNOSIS — B373 Candidiasis of vulva and vagina: Secondary | ICD-10-CM

## 2020-01-16 DIAGNOSIS — B3731 Acute candidiasis of vulva and vagina: Secondary | ICD-10-CM

## 2020-01-16 DIAGNOSIS — L299 Pruritus, unspecified: Secondary | ICD-10-CM

## 2020-01-16 MED ORDER — FLUCONAZOLE 150 MG PO TABS: ORAL_TABLET | ORAL | 1 refills | Status: DC

## 2020-01-16 NOTE — Progress Notes (Signed)
[facility] [address] [place], [place] [address]  Internal MEDICINE  Office Visit Note  Patient Name: Grace [name]  [id number]  [id number]  Date of Service: 01/16/2020   Pt is here for a sick visit.  Chief Complaint  Patient presents with  . Acute Visit    Heart burn had it real bad friday     The patient is here for acute visit. -was having some issues with increased heartburn and generalized pruritis.  -started taking OTC claritin and pepcid. States that these symptoms have resolved.  -now having vaginal discharge which is white in color. States that this is thick discharge. Denies itching or vaginal irritation. She states that she is prone to yeast infections and bacterial vaginosis.       Current Medication:  Outpatient Encounter Medications as of 01/16/2020  Medication Sig  . albuterol (VENTOLIN HFA) 108 (90 Base) MCG/ACT inhaler Inhale 2 puffs into the lungs every 6 (six) hours as needed for wheezing or shortness of breath.  . chlorpheniramine-HYDROcodone (TUSSIONEX PENNKINETIC ER) 10-8 MG/5ML SUER Take 5 mLs by mouth at bedtime as needed for cough.  Marland Kitchen DOK 100 MG capsule TAKE 1 CAPSULE(100 MG) BY MOUTH TWICE DAILY  . famotidine (PEPCID) 20 MG tablet TAKE 1 TABLET(20 MG) BY MOUTH TWICE DAILY  . fluticasone (FLONASE) 50 MCG/ACT nasal spray instill 2 sprays into each nostril once daily  . triamcinolone ointment (KENALOG) 0.5 % Apply 1 application topically 2 (two) times daily.  . valACYclovir (VALTREX) 1000 MG tablet TAKE 1 TABLET(1000 MG) BY MOUTH DAILY  . Vitamin D, Ergocalciferol, (DRISDOL) 1.25 MG (50000 UNIT) CAPS capsule TAKE 1 CAPSULE BY MOUTH 1 TIME A WEEK  . [DISCONTINUED] azithromycin (ZITHROMAX) 250 MG tablet z-pack - take as directed for 5 days  . [DISCONTINUED] fluconazole (DIFLUCAN) 150 MG tablet Take one tab po qd x 3 days and then once a week x 2 weeks  . fluconazole (DIFLUCAN) 150 MG tablet Take one tab po qd x 3 days and then once a  week x 2 weeks   No facility-administered encounter medications on file as of 01/16/2020.      Medical History: Past Medical History:  Diagnosis Date  . Anemia   . BV (bacterial vaginosis)   . Genital herpes   . Gonorrhea   . Trichomoniasis      Today's Vitals   01/16/20 0914  BP: 130/67  Pulse: 84  Resp: 16  Temp: 97.8 F (36.6 C)  SpO2: 99%  Weight: 197 lb (89.4 kg)  Height: 5\' 3"  (1.6 m)   Body mass index is 34.9 kg/m.   Review of Systems  Constitutional: Negative for activity change, chills, fatigue and unexpected weight change.  HENT: Negative for congestion, postnasal drip, rhinorrhea, sneezing and sore throat.   Respiratory: Negative for cough, chest tightness and shortness of breath.   Cardiovascular: Negative for chest pain and palpitations.  Gastrointestinal: Negative for abdominal pain, constipation, diarrhea, nausea and vomiting.       Recent issues with GERD. Has improved since she started taking OTC pepcid and Claritin.   Genitourinary: Positive for vaginal discharge. Negative for dysuria and frequency.       Has noted thick, white discharge. No vaginal itching or irritation. No odor to discharge.   Musculoskeletal: Negative for arthralgias, back pain, joint swelling and neck pain.  Skin: Negative for rash.       Generalized itching.   Neurological: Negative.  Negative for tremors and numbness.  Hematological:  Negative for adenopathy. Does not bruise/bleed easily.  Psychiatric/Behavioral: Negative for behavioral problems (Depression), sleep disturbance and suicidal ideas. The patient is not nervous/anxious.     Physical Exam Vitals and nursing note reviewed.  Constitutional:      General: She is not in acute distress.    Appearance: Normal appearance. She is well-developed and well-nourished. She is not diaphoretic.  HENT:     Head: Normocephalic and atraumatic.     Mouth/Throat:     Mouth: Oropharynx is clear and moist.     Pharynx: No  oropharyngeal exudate.  Eyes:     Extraocular Movements: EOM normal.     Pupils: Pupils are equal, round, and reactive to light.  Neck:     Thyroid: No thyromegaly.     Vascular: No JVD.     Trachea: No tracheal deviation.  Cardiovascular:     Rate and Rhythm: Normal rate and regular rhythm.     Heart sounds: Normal heart sounds. No murmur heard. No friction rub. No gallop.   Pulmonary:     Effort: Pulmonary effort is normal. No respiratory distress.     Breath sounds: Normal breath sounds. No wheezing or rales.  Chest:     Chest wall: No tenderness.  Abdominal:     Palpations: Abdomen is soft.  Musculoskeletal:        General: Normal range of motion.     Cervical back: Normal range of motion and neck supple.  Lymphadenopathy:     Cervical: No cervical adenopathy.  Skin:    General: Skin is warm and dry.  Neurological:     General: No focal deficit present.     Mental Status: She is alert and oriented to person, place, and time.     Cranial Nerves: No cranial nerve deficit.  Psychiatric:        Mood and Affect: Mood and affect and mood normal.        Behavior: Behavior normal.        Thought Content: Thought content normal.        Judgment: Judgment normal.   Assessment/Plan: 1. Candida vaginitis Treatment with diflucan. Take daily for three days, then once weekly for two weeks. May repeat full treatment one time if needed  - fluconazole (DIFLUCAN) 150 MG tablet; Take one tab po qd x 3 days and then once a week x 2 weeks  Dispense: 5 tablet; Refill: 1  2. Gastroesophageal reflux disease without esophagitis Improved with OTC pepcid as needed. Patient encouraged to avoid trigger type foods and to keep food diary to distinguish cause.  3. Generalized pruritus Improved with OTC claritin daily. Encouraged her to monitor daily activities, especially if symptoms return to distinguish a cause.   General Counseling: Rebekka verbalizes understanding of the findings of todays  visit and agrees with plan of treatment. I have discussed any further diagnostic evaluation that may be needed or ordered today. We also reviewed her medications today. she has been encouraged to call the office with any questions or concerns that should arise related to todays visit.    Counseling:  This patient was seen by Vincent Gros FNP Collaboration with Dr Lyndon Code as a part of collaborative care agreement  Meds ordered this encounter  Medications  . fluconazole (DIFLUCAN) 150 MG tablet    Sig: Take one tab po qd x 3 days and then once a week x 2 weeks    Dispense:  5 tablet    Refill:  1  Order Specific Question:   Supervising Provider    Answer:   Lavera Guise [[id number]]    Time spent: 25 Minutes

## 2020-03-26 ENCOUNTER — Ambulatory Visit: Payer: Medicaid Other | Admitting: Physician Assistant

## 2020-03-26 ENCOUNTER — Other Ambulatory Visit: Payer: Self-pay

## 2020-03-26 ENCOUNTER — Encounter: Payer: Self-pay | Admitting: Physician Assistant

## 2020-03-26 DIAGNOSIS — M25551 Pain in right hip: Secondary | ICD-10-CM

## 2020-03-26 DIAGNOSIS — E669 Obesity, unspecified: Secondary | ICD-10-CM | POA: Diagnosis not present

## 2020-03-26 MED ORDER — CYCLOBENZAPRINE HCL 10 MG PO TABS: 10.0000 mg | ORAL_TABLET | Freq: Every day | ORAL | 0 refills | Status: DC

## 2020-03-26 MED ORDER — MELOXICAM 15 MG PO TABS: 15.0000 mg | ORAL_TABLET | Freq: Every day | ORAL | 0 refills | Status: DC

## 2020-03-26 NOTE — Progress Notes (Signed)
[facility] [address] [place], [place] [address]  Internal MEDICINE  Office Visit Note  Patient Name: Grace [name]  [id number]  [id number]  Date of Service: 03/27/2020  Chief Complaint  Patient presents with  . Hip Pain    Right hip kind of feels like a pulled muscle but she doesn't recall doing anything.  Going on for 3-4 months now     HPI Pt is here for a sick visit. -For the last 3-4 months has had R hip pain, worse with movement esp leaning on that side. Sitting doesn't bother it. Can also feel it strain if she strectes by leaning away from that side. Pain is aggravating bc it has been going on for so long, not severe. She has tried ibuprofen but that didn't help. Tried heating pad and didn't help. Has tried stretching some.  -She does not exercise, except for some walking.  -She is in the process of changing jobs and states she does not currently feel like she sits all day but might be sitting more with new work from home job?  Current Medication:  Outpatient Encounter Medications as of 03/26/2020  Medication Sig  . DOK 100 MG capsule TAKE 1 CAPSULE(100 MG) BY MOUTH TWICE DAILY  . famotidine (PEPCID) 20 MG tablet TAKE 1 TABLET(20 MG) BY MOUTH TWICE DAILY  . fluticasone (FLONASE) 50 MCG/ACT nasal spray instill 2 sprays into each nostril once daily  . valACYclovir (VALTREX) 1000 MG tablet TAKE 1 TABLET(1000 MG) BY MOUTH DAILY  . Vitamin D, Ergocalciferol, (DRISDOL) 1.25 MG (50000 UNIT) CAPS capsule TAKE 1 CAPSULE BY MOUTH 1 TIME A WEEK  . [DISCONTINUED] cyclobenzaprine (FLEXERIL) 10 MG tablet Take 1 tablet (10 mg total) by mouth at bedtime.  . [DISCONTINUED] meloxicam (MOBIC) 15 MG tablet Take 1 tablet (15 mg total) by mouth daily.  . [DISCONTINUED] albuterol (VENTOLIN HFA) 108 (90 Base) MCG/ACT inhaler Inhale 2 puffs into the lungs every 6 (six) hours as needed for wheezing or shortness of breath. (Patient not taking: Reported on 03/26/2020)  .  [DISCONTINUED] chlorpheniramine-HYDROcodone (TUSSIONEX PENNKINETIC ER) 10-8 MG/5ML SUER Take 5 mLs by mouth at bedtime as needed for cough. (Patient not taking: Reported on 03/26/2020)  . [DISCONTINUED] fluconazole (DIFLUCAN) 150 MG tablet Take one tab po qd x 3 days and then once a week x 2 weeks (Patient not taking: Reported on 03/26/2020)  . [DISCONTINUED] triamcinolone ointment (KENALOG) 0.5 % Apply 1 application topically 2 (two) times daily. (Patient not taking: Reported on 03/26/2020)   No facility-administered encounter medications on file as of 03/26/2020.      Medical History: Past Medical History:  Diagnosis Date  . Anemia   . BV (bacterial vaginosis)   . Genital herpes   . Gonorrhea   . Trichomoniasis      Vital Signs: BP 126/60   Pulse 93   Temp 98.1 F (36.7 C)   Resp 16   Ht 5\' 3"  (1.6 m)   Wt 203 lb 6.4 oz (92.3 kg)   SpO2 99%   BMI 36.03 kg/m    Review of Systems  Constitutional: Negative for fatigue and fever.  HENT: Negative for congestion, mouth sores and postnasal drip.   Respiratory: Negative for cough and shortness of breath.   Cardiovascular: Negative for chest pain.  Genitourinary: Negative for flank pain.  Musculoskeletal: Positive for arthralgias and myalgias. Negative for back pain, gait problem and joint swelling.       R hip pain when stretched  or leaning on that side  Psychiatric/Behavioral: Negative.     Physical Exam Constitutional:      General: She is not in acute distress.    Appearance: She is well-developed. She is obese. She is not diaphoretic.  HENT:     Head: Normocephalic and atraumatic.     Mouth/Throat:     Pharynx: No oropharyngeal exudate.  Eyes:     Pupils: Pupils are equal, round, and reactive to light.  Neck:     Thyroid: No thyromegaly.     Vascular: No JVD.     Trachea: No tracheal deviation.  Cardiovascular:     Rate and Rhythm: Normal rate and regular rhythm.     Heart sounds: Normal heart sounds. No murmur  heard. No friction rub. No gallop.   Pulmonary:     Effort: Pulmonary effort is normal. No respiratory distress.     Breath sounds: No wheezing or rales.  Chest:     Chest wall: No tenderness.  Abdominal:     General: Bowel sounds are normal.     Palpations: Abdomen is soft.  Musculoskeletal:        General: Normal range of motion.     Cervical back: Normal range of motion and neck supple.     Right lower leg: No edema.     Left lower leg: No edema.     Comments: Full ROM without any pain, and R hip is Non TTP.  Lymphadenopathy:     Cervical: No cervical adenopathy.  Skin:    General: Skin is warm and dry.  Neurological:     Mental Status: She is alert and oriented to person, place, and time.     Cranial Nerves: No cranial nerve deficit.     Sensory: No sensory deficit.     Gait: Gait normal.  Psychiatric:        Behavior: Behavior normal.        Thought Content: Thought content normal.        Judgment: Judgment normal.       Assessment/Plan: 1. Right hip pain Will try starting mobic once per day and flexeril at night for the next few weeks. Educated pt to try to stretch the area and use foam roller to assist in reducing muscle tension. Also educated to make sure she is getting in some regular activity to help prevent muscle stiffness that can arise from sitting all day.  2. Obesity (BMI 35.0-39.9 without comorbidity) Obesity Counseling: Had a lengthy discussion regarding patients BMI and weight issues. Patient was instructed on portion control as well as increased activity. Also discussed caloric restrictions with trying to maintain intake less than 2000 Kcal. Discussions were made in accordance with the 5As of weight management. Simple actions such as not eating late and if able to, taking a walk is suggested.    General Counseling: [name] verbalizes understanding of the findings of todays visit and agrees with plan of treatment. I have discussed any further diagnostic  evaluation that may be needed or ordered today. We also reviewed her medications today. she has been encouraged to call the office with any questions or concerns that should arise related to todays visit.    Counseling:    No orders of the defined types were placed in this encounter.   Meds ordered this encounter  Medications  . DISCONTD: meloxicam (MOBIC) 15 MG tablet    Sig: Take 1 tablet (15 mg total) by mouth daily.    Dispense:  30 tablet    Refill:  0  . DISCONTD: cyclobenzaprine (FLEXERIL) 10 MG tablet    Sig: Take 1 tablet (10 mg total) by mouth at bedtime.    Dispense:  30 tablet    Refill:  0    Time spent:30 Minutes

## 2020-04-26 ENCOUNTER — Ambulatory Visit: Payer: Medicaid Other | Admitting: Physician Assistant

## 2020-05-24 ENCOUNTER — Emergency Department (HOSPITAL_COMMUNITY)
Admission: EM | Admit: 2020-05-24 | Discharge: 2020-05-24 | Disposition: A | Discharge status: Home / Self Care | Payer: Medicaid Other | Attending: Emergency Medicine | Admitting: Emergency Medicine

## 2020-05-24 ENCOUNTER — Encounter (HOSPITAL_COMMUNITY): Payer: Self-pay | Admitting: Pharmacy Technician

## 2020-05-24 ENCOUNTER — Encounter: Payer: Self-pay | Admitting: Internal Medicine

## 2020-05-24 ENCOUNTER — Other Ambulatory Visit: Payer: Self-pay

## 2020-05-24 ENCOUNTER — Emergency Department (HOSPITAL_COMMUNITY): Payer: Medicaid Other

## 2020-05-24 DIAGNOSIS — R109 Unspecified abdominal pain: Secondary | ICD-10-CM | POA: Diagnosis not present

## 2020-05-24 DIAGNOSIS — M25551 Pain in right hip: Secondary | ICD-10-CM | POA: Diagnosis present

## 2020-05-24 DIAGNOSIS — M546 Pain in thoracic spine: Secondary | ICD-10-CM | POA: Insufficient documentation

## 2020-05-24 DIAGNOSIS — Z87891 Personal history of nicotine dependence: Secondary | ICD-10-CM | POA: Diagnosis not present

## 2020-05-24 DIAGNOSIS — R0781 Pleurodynia: Secondary | ICD-10-CM | POA: Insufficient documentation

## 2020-05-24 DIAGNOSIS — M25559 Pain in unspecified hip: Secondary | ICD-10-CM

## 2020-05-24 LAB — LIPASE, BLOOD: Lipase: 46 U/L (ref 11–51)

## 2020-05-24 LAB — CBC WITH DIFFERENTIAL/PLATELET
Abs Immature Granulocytes: 0.05 10*3/uL (ref 0.00–0.07)
Basophils Absolute: 0 10*3/uL (ref 0.0–0.1)
Basophils Relative: 0 %
Eosinophils Absolute: 0.1 10*3/uL (ref 0.0–0.5)
Eosinophils Relative: 1 %
HCT: 39.2 % (ref 36.0–46.0)
Hemoglobin: 12.3 g/dL (ref 12.0–15.0)
Immature Granulocytes: 1 %
Lymphocytes Relative: 26 %
Lymphs Abs: 2.4 10*3/uL (ref 0.7–4.0)
MCH: 26.2 pg (ref 26.0–34.0)
MCHC: 31.4 g/dL (ref 30.0–36.0)
MCV: 83.4 fL (ref 80.0–100.0)
Monocytes Absolute: 0.9 10*3/uL (ref 0.1–1.0)
Monocytes Relative: 10 %
Neutro Abs: 5.7 10*3/uL (ref 1.7–7.7)
Neutrophils Relative %: 62 %
Platelets: 370 10*3/uL (ref 150–400)
RBC: 4.7 MIL/uL (ref 3.87–5.11)
RDW: 14.2 % (ref 11.5–15.5)
WBC: 9.1 10*3/uL (ref 4.0–10.5)
nRBC: 0 % (ref 0.0–0.2)

## 2020-05-24 LAB — COMPREHENSIVE METABOLIC PANEL
ALT: 16 U/L (ref 0–44)
AST: 18 U/L (ref 15–41)
Albumin: 3.8 g/dL (ref 3.5–5.0)
Alkaline Phosphatase: 68 U/L (ref 38–126)
Anion gap: 8 (ref 5–15)
BUN: 8 mg/dL (ref 6–20)
CO2: 27 mmol/L (ref 22–32)
Calcium: 9.6 mg/dL (ref 8.9–10.3)
Chloride: 104 mmol/L (ref 98–111)
Creatinine, Ser: 1 mg/dL (ref 0.44–1.00)
GFR, Estimated: >60 mL/min (ref >60)
Glucose, Bld: 72 mg/dL (ref 70–99)
Potassium: 4.2 mmol/L (ref 3.5–5.1)
Sodium: 139 mmol/L (ref 135–145)
Total Bilirubin: 0.4 mg/dL (ref 0.3–1.2)
Total Protein: 6.6 g/dL (ref 6.5–8.1)

## 2020-05-24 LAB — HCG, QUANTITATIVE, PREGNANCY: hCG, Beta Chain, Quant, S: <1 m[IU]/mL (ref <5)

## 2020-05-24 MED ORDER — CYCLOBENZAPRINE HCL 10 MG PO TABS
5.0000 mg | ORAL_TABLET | Freq: Once | ORAL | Status: AC
  Administered 2020-05-24: 5 mg via ORAL
  Filled 2020-05-24: qty 1

## 2020-05-24 MED ORDER — LIDOCAINE 5 % EX PTCH
1.0000 | MEDICATED_PATCH | CUTANEOUS | Status: DC
  Administered 2020-05-24: 1 via TRANSDERMAL
  Filled 2020-05-24: qty 1

## 2020-05-24 NOTE — ED Provider Notes (Signed)
[facility] EMERGENCY DEPARTMENT Provider Note   CSN: [id number] Arrival date & time: 05/24/20  0854     History Chief Complaint  Patient presents with  . Hip Pain    ALANEE TING is a 35 y.o. female.  HPI 35 year old female with no pertinent medical history presents emergency department for right upper middle back pain.  States that it is episodic and feels like it is muscle spasm, moderate to severe.  States that it started yesterday.  She felt like it was worse when she was eating her breakfast, nothing makes it better.  Has not taken anything for the pain.  Does endorse right-sided hip pain, but states that it has been for 6 months.  Is walking well and has plan to follow-up with her normal doctor.  She presented primarily for the possible muscle spasm.    Past Medical History:  Diagnosis Date  . Anemia   . BV (bacterial vaginosis)   . Genital herpes   . Gonorrhea   . Trichomoniasis     Patient Active Problem List   Diagnosis Date Noted  . Candida vaginitis 01/16/2020  . Gastroesophageal reflux disease without esophagitis 01/16/2020  . Generalized pruritus 01/16/2020  . Acute upper respiratory infection 01/02/2020  . Chlamydia 03/03/2017  . Herpes genitalia 08/19/2016    Past Surgical History:  Procedure Laterality Date  . Cesearen Secdtion N/A 01/05/2013     OB History    Gravida  3   Para  3   Term  2   Preterm  0   AB  0   Living  3     SAB  0   IAB  0   Ectopic  0   Multiple  0   Live Births  3           Family History  Problem Relation Age of Onset  . Lung cancer Maternal Grandmother   . Cancer Maternal Grandmother   . Lung cancer Maternal Grandfather   . Cancer Maternal Grandfather   . Cancer Paternal Grandfather   . Cancer Paternal Grandmother   . Diabetes Mother   . Breast cancer Neg Hx     Social History   Tobacco Use  . Smoking status: Former Smoker    Packs/day: 1.00    Types: Cigars  .  Smokeless tobacco: Never Used  . Tobacco comment: doesn't want medications to help stop smoking  Vaping Use  . Vaping Use: Former  Substance Use Topics  . Alcohol use: Yes    Comment: occ  . Drug use: Yes    Types: Marijuana    Home Medications Prior to Admission medications   Medication Sig Start Date End Date Taking? Authorizing Provider  cyclobenzaprine (FLEXERIL) 10 MG tablet Take 1 tablet (10 mg total) by mouth at bedtime. 03/26/20   Lyndon Code, MD  DOK 100 MG capsule TAKE 1 CAPSULE(100 MG) BY MOUTH TWICE DAILY 05/26/19   Johnna Acosta, NP  famotidine (PEPCID) 20 MG tablet TAKE 1 TABLET(20 MG) BY MOUTH TWICE DAILY 05/26/19   Johnna Acosta, NP  fluticasone Aleda Grana) 50 MCG/ACT nasal spray instill 2 sprays into each nostril once daily 02/17/17   [provider]  meloxicam (MOBIC) 15 MG tablet Take 1 tablet (15 mg total) by mouth daily. 03/26/20   Lyndon Code, MD  valACYclovir (VALTREX) 1000 MG tablet TAKE 1 TABLET(1000 MG) BY MOUTH DAILY 12/06/19   Theotis Burrow, NP  Vitamin D, Ergocalciferol, (  DRISDOL) 1.25 MG (50000 UNIT) CAPS capsule TAKE 1 CAPSULE BY MOUTH 1 TIME A WEEK 06/10/19   Lyndon Code, MD    Allergies    Doxycycline  Review of Systems   Review of Systems  Constitutional: Negative for chills and fever.  HENT: Negative for ear pain and sore throat.   Eyes: Negative for pain and visual disturbance.  Respiratory: Negative for cough and shortness of breath.   Cardiovascular: Negative for chest pain and palpitations.  Gastrointestinal: Negative for abdominal pain and vomiting.  Genitourinary: Negative for dysuria and hematuria.  Musculoskeletal: Positive for arthralgias and back pain.  Skin: Negative for color change and rash.  Neurological: Negative for seizures and syncope.  Psychiatric/Behavioral: Negative for sleep disturbance.  All other systems reviewed and are negative.   Physical Exam Updated Vital Signs BP 112/68 (BP Location: Right Arm)    Pulse 95   Temp 99 F (37.2 C) (Oral)   Resp 15   SpO2 99%   Physical Exam Vitals and nursing note reviewed.  Constitutional:      General: She is not in acute distress.    Appearance: She is well-developed.  HENT:     Head: Normocephalic and atraumatic.     Right Ear: External ear normal.     Left Ear: External ear normal.     Nose: Nose normal.  Eyes:     Conjunctiva/sclera: Conjunctivae normal.  Cardiovascular:     Rate and Rhythm: Normal rate and regular rhythm.     Heart sounds: No murmur heard.   Pulmonary:     Effort: Pulmonary effort is normal. No respiratory distress.     Breath sounds: Normal breath sounds.  Abdominal:     General: Abdomen is flat.     Palpations: Abdomen is soft.     Tenderness: There is no abdominal tenderness. There is no guarding or rebound.  Musculoskeletal:        General: Normal range of motion.     Cervical back: Neck supple.     Comments: Some tenderness to right lower rib cage without overlying skin changes.  No deformity or swelling.  Skin:    General: Skin is warm and dry.     Capillary Refill: Capillary refill takes less than 2 seconds.  Neurological:     General: No focal deficit present.     Mental Status: She is alert and oriented to person, place, and time.  Psychiatric:        Mood and Affect: Mood normal.     ED Results / Procedures / Treatments   Labs (all labs ordered are listed, but only abnormal results are displayed) Labs Reviewed  CBC WITH DIFFERENTIAL/PLATELET  COMPREHENSIVE METABOLIC PANEL  LIPASE, BLOOD  HCG, QUANTITATIVE, PREGNANCY  URINALYSIS, ROUTINE W REFLEX MICROSCOPIC    EKG None  Radiology DG Hip Unilat W or Wo Pelvis 2-3 Views Right  Result Date: 05/24/2020 CLINICAL DATA:  Right hip pain EXAM: DG HIP (WITH OR WITHOUT PELVIS) 2-3V RIGHT COMPARISON:  None. FINDINGS: There is no evidence of hip fracture or dislocation. There is no evidence of arthropathy or other focal bone abnormality.  IMPRESSION: Negative. Electronically Signed   By: Signa Kell M.D.   On: 05/24/2020 11:48    Procedures Procedures   Medications Ordered in ED Medications  lidocaine (LIDODERM) 5 % 1 patch (1 patch Transdermal Patch Applied 05/24/20 1227)  cyclobenzaprine (FLEXERIL) tablet 5 mg (5 mg Oral Given 05/24/20 1226)    ED Course  I have reviewed the triage vital signs and the nursing notes.  Pertinent labs & imaging results that were available during my care of the patient were reviewed by me and considered in my medical decision making (see chart for details).    MDM Rules/Calculators/A&P                          35 year old female presents with right-sided back pain revealed a room.  Vital signs are stable, no acute distress.  Exam with some tenderness to palpation to right upper back.  No right upper quadrant pain.  No overlying skin changes consistent with zoster.  Presentation most consistent with musculoskeletal right back pain.  She does state that it might be worse with eating, possible cholecystitis.  Will obtain abdominal exam and reevaluate.  Also ordered muscle relaxer and lidocaine patch.   This likely cholecystitis without nausea or vomiting.  Not consistent with shingles.  Low suspicion for rib fracture without recent traumatic injury.  Will order urinalysis to assess for pyelonephritis versus nephrolithiasis, though these are less likely due to patient's comfort level and lack of infectious symptoms.  Labs show negative pregnancy test.  CBC, CMP, and lipase unremarkable.  X-ray of right hip shows no acute fracture or dislocation.  Upon reevaluation, patient felt better.  She is refusing to wait for urinalysis.  She understands that that would be an important step in diagnosing possible kidney stone or kidney infection.  She states she understands risk and the benefits and still would like to go home.  She feels comfortable following up very closely with her PCP.  We discussed  symptomatic management and return precautions.  Reinforced the importance of her following up with her PCP.  Patient discharged in stable condition.  Final Clinical Impression(s) / ED Diagnoses Final diagnoses:  Hip pain  Flank pain    Rx / DC Orders ED Discharge Orders    None       Louretta Parma, DO 05/24/20 1514    Milagros Loll, MD 05/30/20 [phone]

## 2020-05-24 NOTE — Discharge Instructions (Signed)
We recommended waiting in the emergency room for further diagnostic work-up and observation.  If you change your mind anytime, your pain worsens, he develop fever or other new concerning symptom, please come back immediately.  Please follow-up with your primary doctor.

## 2020-05-24 NOTE — ED Triage Notes (Signed)
Pt here with reports of R hip pain onset yesterday. Pt states it feels like muscle spasms. Denies known injury. Ambulatory with a steady gait.

## 2020-07-17 ENCOUNTER — Encounter: Payer: Medicaid Other | Admitting: Nurse Practitioner

## 2021-03-03 ENCOUNTER — Emergency Department (HOSPITAL_COMMUNITY)
Admission: EM | Admit: 2021-03-03 | Discharge: 2021-03-03 | Disposition: A | Discharge status: Home / Self Care | Payer: Medicaid Other | Attending: Emergency Medicine | Admitting: Emergency Medicine

## 2021-03-03 DIAGNOSIS — T7840XA Allergy, unspecified, initial encounter: Secondary | ICD-10-CM | POA: Diagnosis present

## 2021-03-03 MED ORDER — PREDNISONE 10 MG (21) PO TBPK: ORAL_TABLET | Freq: Every day | ORAL | 0 refills | Status: DC

## 2021-03-03 NOTE — ED Provider Notes (Signed)
[facility] EMERGENCY DEPARTMENT Provider Note   CSN: [id number] Arrival date & time: 03/03/21  0030     History  Chief Complaint  Patient presents with   Allergic Reaction    Grace [name] is a 36 y.o. female.  HPI Patient is a 36 year old female who presents to the emergency department due to an itchy rash that began yesterday.  Patient states it started on her right wrist and began spreading up the left arm and is now on all 4 extremities.  Denies any fevers, chills, facial swelling, difficulty swallowing, chest pain, shortness of breath.  Denies any new soaps, detergents, fragrances, lotions, or clothing.  Denies any relatives in her home with a similar rash.  Patient has been taking Claritin for symptoms with minimal relief.  Patient also notes that she recently took a 5-day course of valacyclovir, which she finished 2 days ago.  Also notes that she ate seafood prior to the onset of her symptoms.    Home Medications Prior to Admission medications   Medication Sig Start Date End Date Taking? Authorizing Provider  predniSONE (STERAPRED UNI-PAK 21 TAB) 10 MG (21) TBPK tablet Take by mouth daily. Take 6 tabs by mouth daily  for 2 days, then 5 tabs for 2 days, then 4 tabs for 2 days, then 3 tabs for 2 days, 2 tabs for 2 days, then 1 tab by mouth daily for 2 days 03/03/21  Yes Placido Sou, PA-C  cyclobenzaprine (FLEXERIL) 10 MG tablet Take 1 tablet (10 mg total) by mouth at bedtime. 03/26/20   Lyndon Code, MD  DOK 100 MG capsule TAKE 1 CAPSULE(100 MG) BY MOUTH TWICE DAILY 05/26/19   Johnna Acosta, NP  famotidine (PEPCID) 20 MG tablet TAKE 1 TABLET(20 MG) BY MOUTH TWICE DAILY 05/26/19   Johnna Acosta, NP  fluticasone Aleda Grana) 50 MCG/ACT nasal spray instill 2 sprays into each nostril once daily 02/17/17   [provider]  meloxicam (MOBIC) 15 MG tablet Take 1 tablet (15 mg total) by mouth daily. 03/26/20   Lyndon Code, MD  valACYclovir (VALTREX)  1000 MG tablet TAKE 1 TABLET(1000 MG) BY MOUTH DAILY 12/06/19   Theotis Burrow, NP  Vitamin D, Ergocalciferol, (DRISDOL) 1.25 MG (50000 UNIT) CAPS capsule TAKE 1 CAPSULE BY MOUTH 1 TIME A WEEK 06/10/19   Lyndon Code, MD      Allergies    Doxycycline    Review of Systems   Review of Systems  All other systems reviewed and are negative. Ten systems reviewed and are negative for acute change, except as noted in the HPI.   Physical Exam Updated Vital Signs BP 98/65    Pulse 71    Temp 98.7 F (37.1 C) (Oral)    Resp 14    SpO2 100%  Physical Exam Vitals and nursing note reviewed.  Constitutional:      General: She is not in acute distress.    Appearance: Normal appearance. She is not ill-appearing, toxic-appearing or diaphoretic.  HENT:     Head: Normocephalic and atraumatic.     Right Ear: External ear normal.     Left Ear: External ear normal.     Nose: Nose normal.     Mouth/Throat:     Mouth: Mucous membranes are moist.     Pharynx: Oropharynx is clear. No oropharyngeal exudate or posterior oropharyngeal erythema.     Comments: Uvula midline.  No perioral or intraoral edema noted.  Readily handling  secretions.  No stridor.  No hot potato voice. Eyes:     General: No scleral icterus.       Right eye: No discharge.        Left eye: No discharge.     Extraocular Movements: Extraocular movements intact.     Conjunctiva/sclera: Conjunctivae normal.  Cardiovascular:     Rate and Rhythm: Normal rate and regular rhythm.     Pulses: Normal pulses.     Heart sounds: Normal heart sounds. No murmur heard.   No friction rub. No gallop.  Pulmonary:     Effort: Pulmonary effort is normal. No respiratory distress.     Breath sounds: Normal breath sounds. No stridor. No wheezing, rhonchi or rales.  Abdominal:     General: Abdomen is flat.     Palpations: Abdomen is soft.     Tenderness: There is no abdominal tenderness.  Musculoskeletal:        General: Normal range of motion.      Cervical back: Normal range of motion and neck supple. No tenderness.  Skin:    General: Skin is warm and dry.     Findings: Erythema present.     Comments: Macular papular erythematous pruritic rash noted on all 4 extremities that appears to be worst around the right wrist.  No involvement of the webspaces of the fingers or toes.  Neurological:     General: No focal deficit present.     Mental Status: She is alert and oriented to person, place, and time.  Psychiatric:        Mood and Affect: Mood normal.        Behavior: Behavior normal.   ED Results / Procedures / Treatments   Labs (all labs ordered are listed, but only abnormal results are displayed) Labs Reviewed - No data to display  EKG None  Radiology No results found.  Procedures Procedures   Medications Ordered in ED Medications - No data to display  ED Course/ Medical Decision Making/ A&P                           Medical Decision Making Risk Prescription drug management.  Patient is a 36 year old female who presents to the emergency department due to what appears to be an allergic reaction.  Patient has a pruritic rash to all 4 extremities.  No chest pain, shortness of breath, facial swelling, difficulty swallowing, nausea, vomiting.    On my exam patient has a macular papular erythematous rash to all 4 extremities.  No involvement of the webspaces of the fingers or toes.  Readily handling secretions.  No perioral or intraoral edema noted.  Physical exam and history not consistent with anaphylaxis.  Patient denies any new lotions or detergents.  No one in her household has a similar rash.  She did recently complete a course of valacyclovir.  She also notes that she ate seafood prior to the onset of her symptoms.  Possibly the source of her rash, though patient does note that she has taken this medication as well as eaten seafood many times in the past without similar symptoms.  We will discharge patient on a course  of prednisone.  She denies history of diabetes mellitus.  Feel that she is stable for discharge at this time and she is agreeable.  Discussed return precautions in length.  Her questions were answered and she was amicable at the time of discharge. Final Clinical Impression(s) /  ED Diagnoses Final diagnoses:  Allergic reaction, initial encounter   Rx / DC Orders ED Discharge Orders          Ordered    predniSONE (STERAPRED UNI-PAK 21 TAB) 10 MG (21) TBPK tablet  Daily        03/03/21 0221              Placido Sou, PA-C 03/03/21 0230    Tilden Fossa, MD 03/03/21 [id number]

## 2021-03-03 NOTE — ED Notes (Signed)
E-signature pad unavailable at time of pt discharge. This RN discussed discharge materials with pt and answered all pt questions. Pt stated understanding of discharge material. ? ?

## 2021-03-03 NOTE — ED Notes (Signed)
Pt advises she is on day 5/7 of valacyclovir

## 2021-03-03 NOTE — ED Triage Notes (Signed)
Pt c/o "allergic reaction" x1 day w associated itchiness. Pt advises no change to soaps, detergents, fragrances. Airway intact, NAD in triage. Claritin for symptoms

## 2021-03-03 NOTE — Discharge Instructions (Signed)
I am prescribing you a strong steroid medication called prednisone.  Please only take this as prescribed.  Please take it early in the morning, as this medication can be stimulating and make it difficult to sleep at night.  If you develop any new or worsening symptoms please do not hesitate to return to the emergency department. It was a pleasure to meet you.

## 2021-09-08 ENCOUNTER — Ambulatory Visit: Payer: Self-pay

## 2022-01-07 IMAGING — CR DG HIP (WITH OR WITHOUT PELVIS) 2-3V*R*
3 series · 3 of 3 positions shown · non-contrast
Comparison: None.

CLINICAL DATA: Right hip pain

EXAM:
DG HIP (WITH OR WITHOUT PELVIS) 2-3V RIGHT

[pelvis ap]
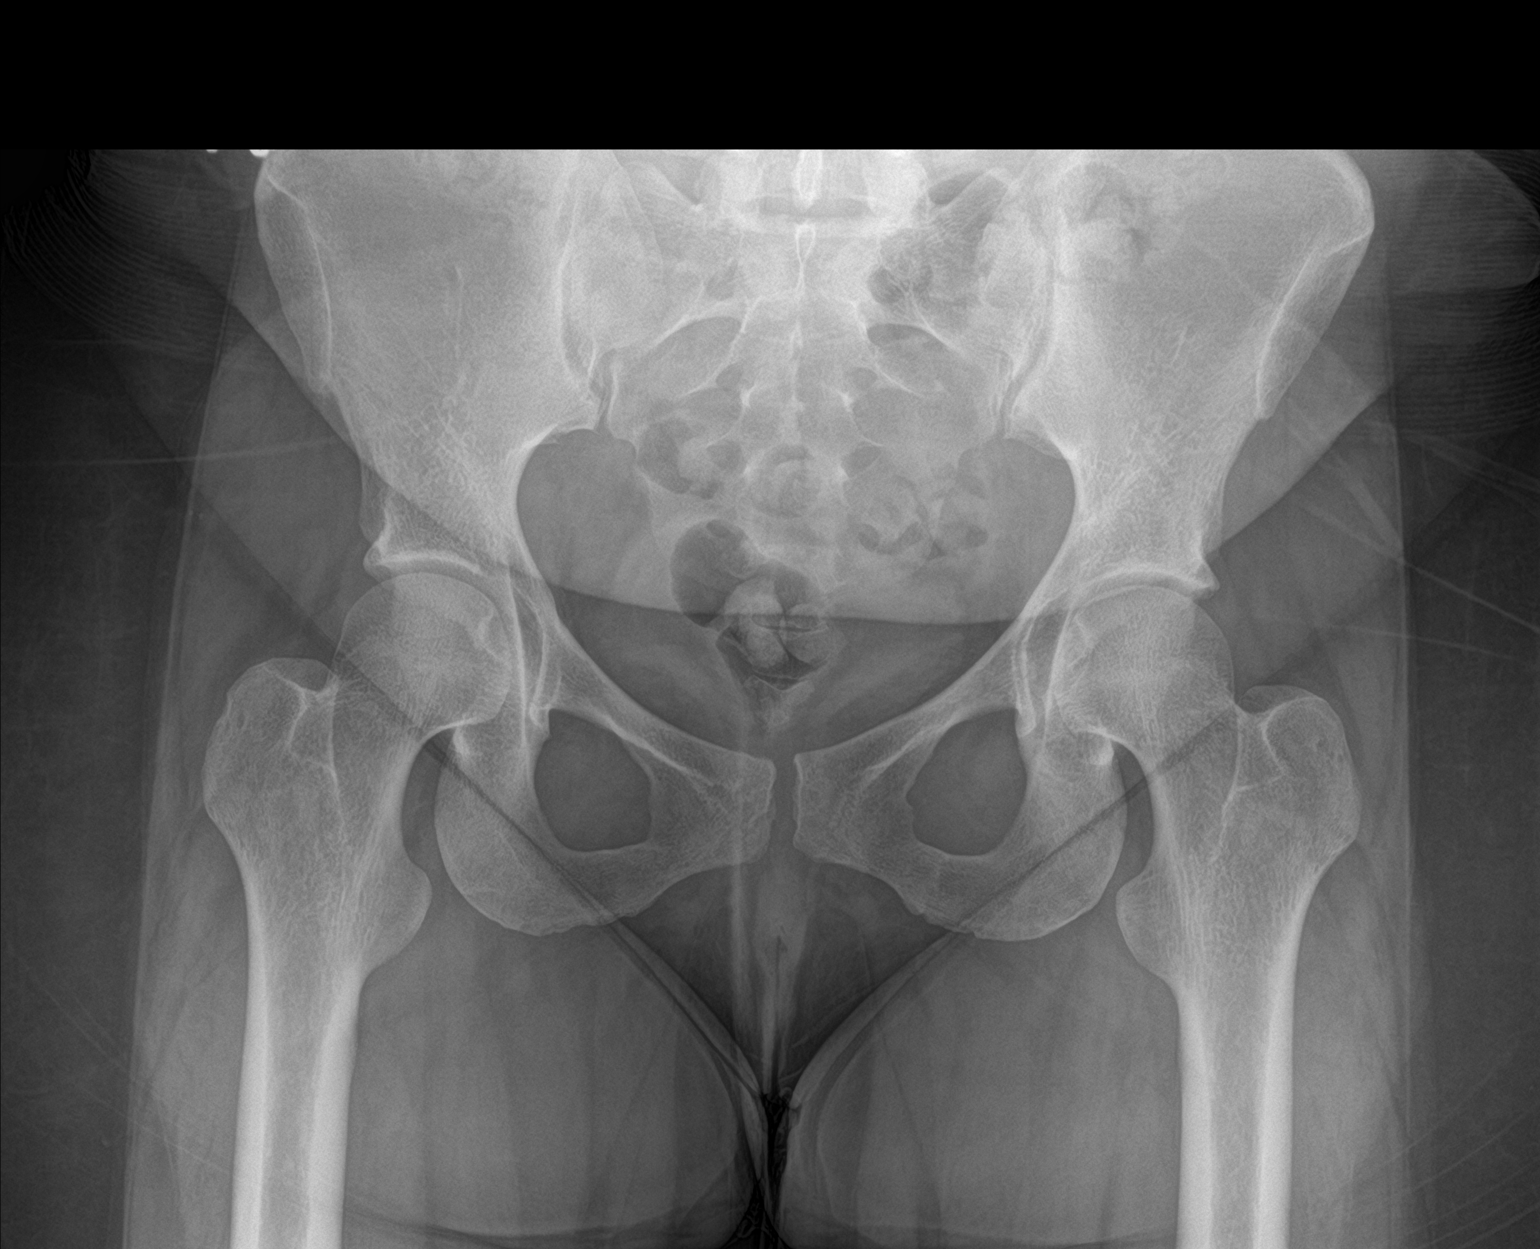

[hip ap]
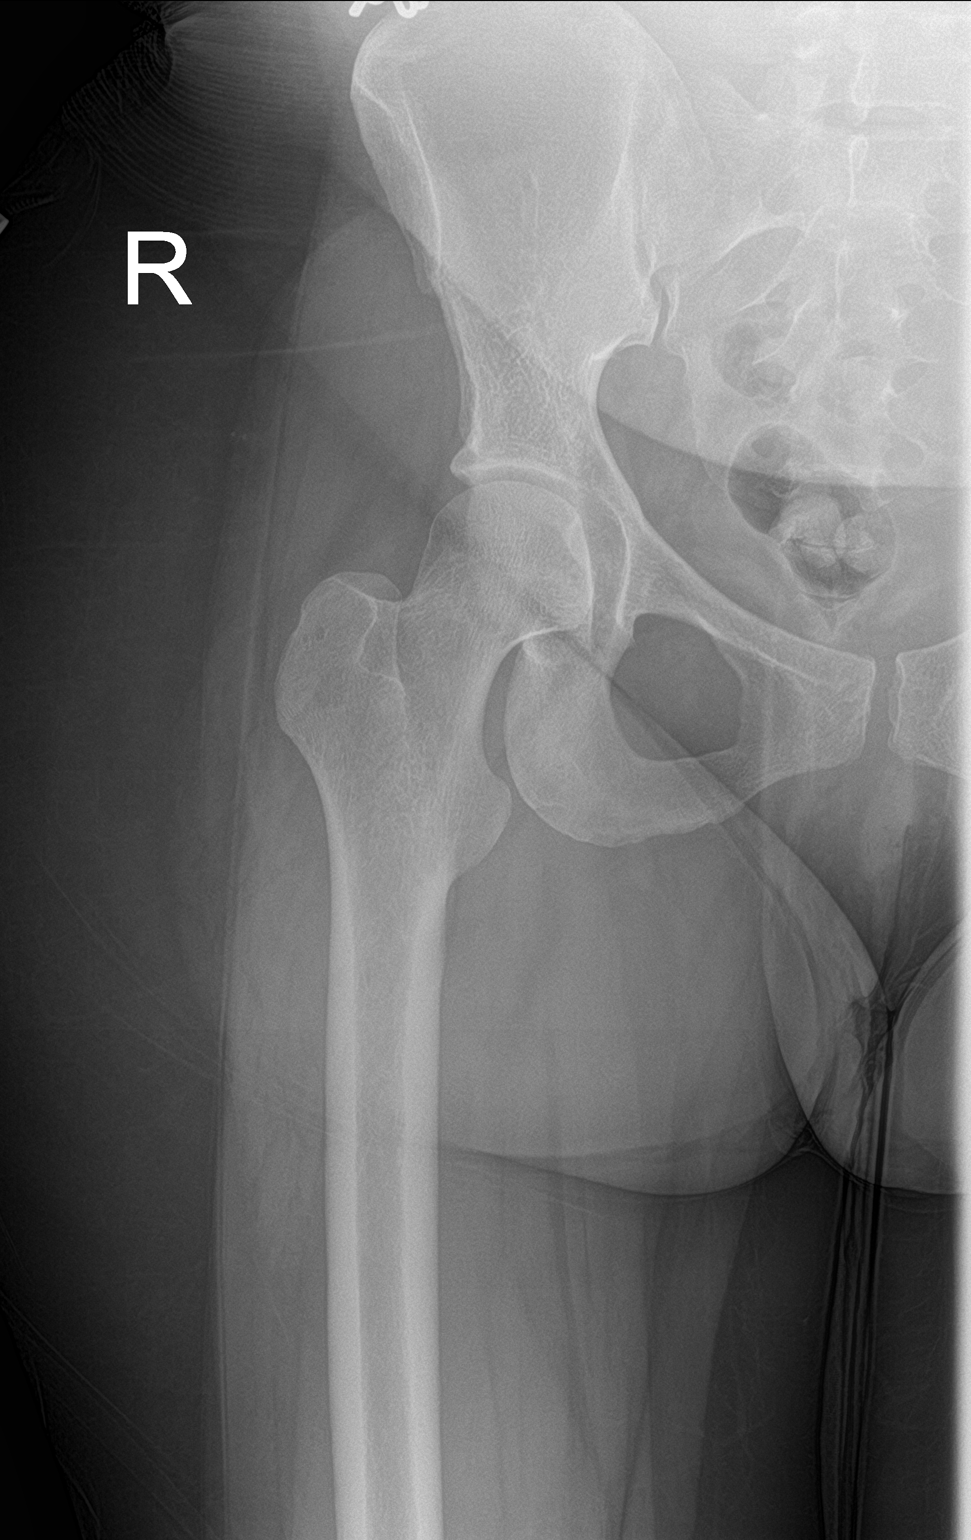

[hip lat]
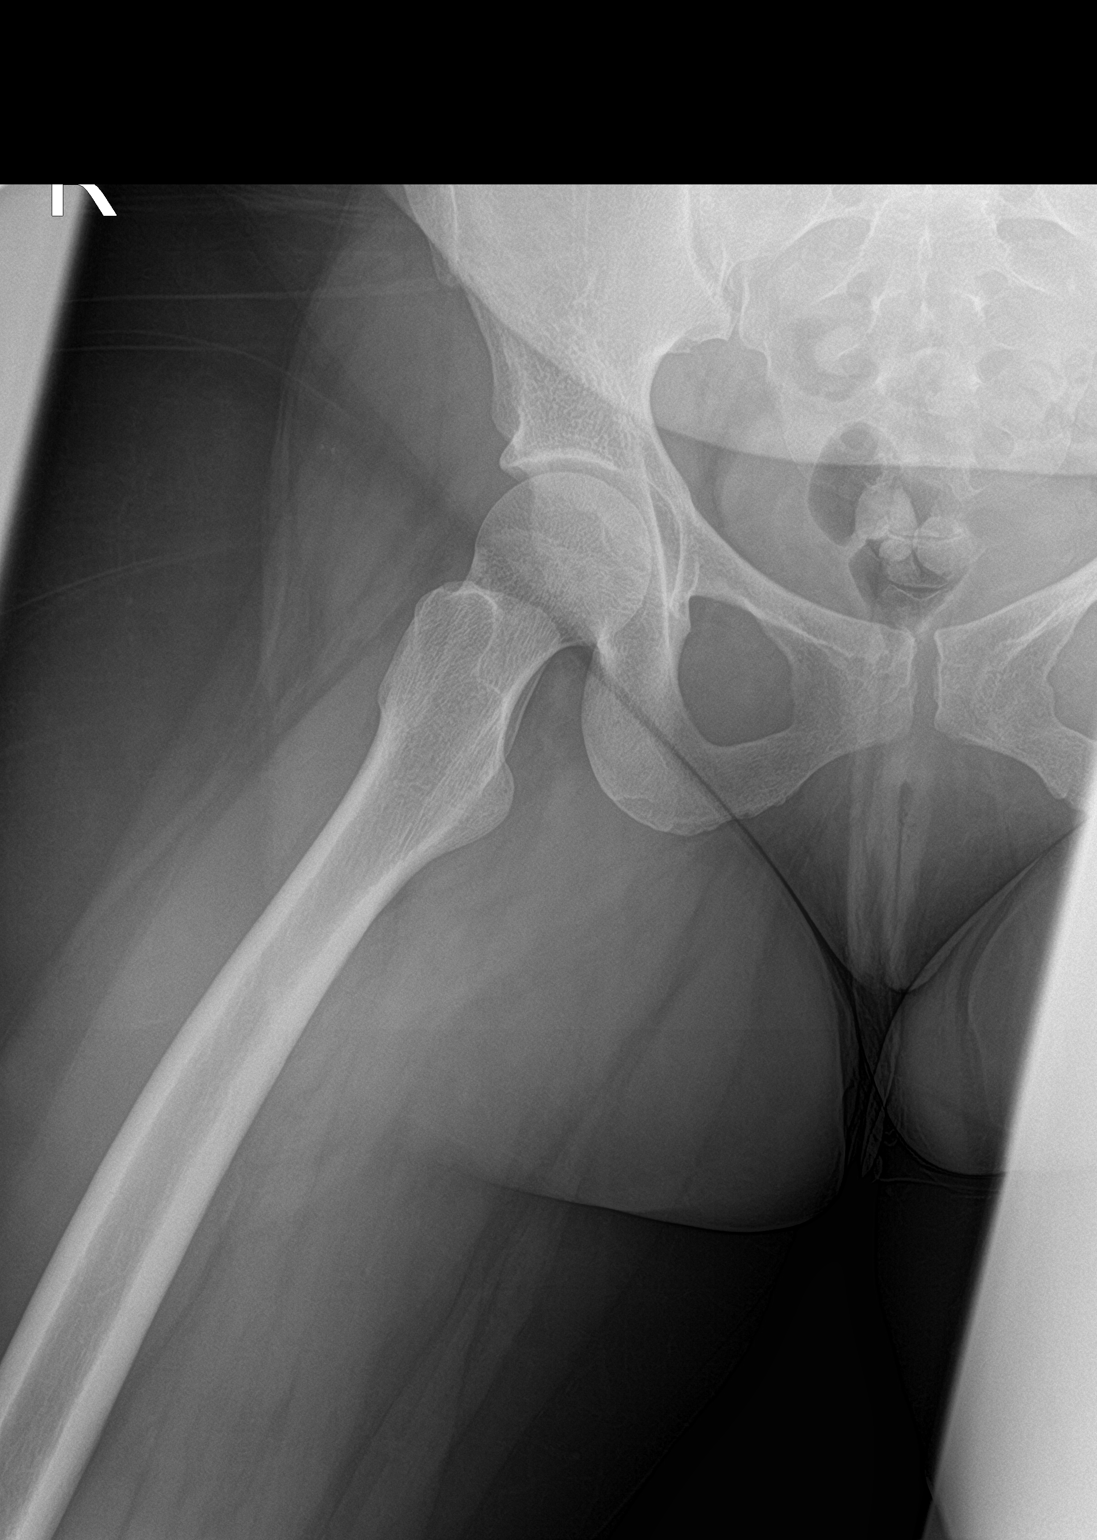

[3 of 3 positions shown; findings below may reference images not displayed]

FINDINGS: There is no evidence of hip fracture or dislocation. There is no
evidence of arthropathy or other focal bone abnormality.
IMPRESSION: Negative.

## 2022-02-05 ENCOUNTER — Ambulatory Visit
Admission: RE | Admit: 2022-02-05 | Discharge: 2022-02-05 | Disposition: A | Discharge status: Home / Self Care | Payer: Medicaid Other | Source: Ambulatory Visit | Attending: Nurse Practitioner | Admitting: Nurse Practitioner

## 2022-02-05 VITALS — BP 120/82 | HR 107 | Temp 99.9°F | Resp 18

## 2022-02-05 DIAGNOSIS — R6889 Other general symptoms and signs: Secondary | ICD-10-CM

## 2022-02-05 DIAGNOSIS — Z28310 Unvaccinated for covid-19: Secondary | ICD-10-CM | POA: Diagnosis not present

## 2022-02-05 DIAGNOSIS — Z1152 Encounter for screening for COVID-19: Secondary | ICD-10-CM | POA: Insufficient documentation

## 2022-02-05 DIAGNOSIS — R051 Acute cough: Secondary | ICD-10-CM | POA: Diagnosis not present

## 2022-02-05 DIAGNOSIS — B349 Viral infection, unspecified: Secondary | ICD-10-CM | POA: Diagnosis not present

## 2022-02-05 MED ORDER — FLUTICASONE PROPIONATE 50 MCG/ACT NA SUSP: 1.0000 | Freq: Every day | NASAL | 0 refills | Status: AC

## 2022-02-05 MED ORDER — OSELTAMIVIR PHOSPHATE 75 MG PO CAPS: 75.0000 mg | ORAL_CAPSULE | Freq: Two times a day (BID) | ORAL | 0 refills | Status: DC

## 2022-02-05 NOTE — Discharge Instructions (Signed)
Start Tamiflu twice daily for 5 days Flonase daily Over-the-counter cough medicine as needed Rest and fluids Please follow-up with your PCP in 2 to 3 days for recheck Please go to emergency room if you have any worsening symptoms

## 2022-02-05 NOTE — ED Provider Notes (Signed)
[facility]    CSN: [id number] Arrival date & time: 02/05/22  1117      History   Chief Complaint Chief Complaint  Patient presents with   Nasal Congestion    Nasal congestion, sneezing, headache, etc. - Entered by patient   Headache   Conjunctivitis    HPI Grace [name] is a 37 y.o. female who presents for evaluation of URI symptoms for 1 days. Patient reports associated symptoms of headache, congestion, sneezing, ear pain, scratchy/sore throat with throat congestion, eye crusting bilaterally this morning. Denies N/V/D, fevers, shortness of breath.  Patient states her symptoms came on quickly and she was fine last night but awoke this morning feeling like "she been hit by a truck".  Patient does not have a hx of asthma or smoking. Patient husband has similar sx. no recent travel. Pt is not vaccinated for COVID. Pt is not vaccinated for flu this season. Pt has taken NQ OTC for symptoms. Pt has no other concerns at this time.    Headache Associated symptoms: congestion, cough, ear pain and sore throat   Conjunctivitis Associated symptoms include headaches.    Past Medical History:  Diagnosis Date   Anemia    BV (bacterial vaginosis)    Genital herpes    Gonorrhea    Trichomoniasis     Patient Active Problem List   Diagnosis Date Noted   Candida vaginitis 01/16/2020   Gastroesophageal reflux disease without esophagitis 01/16/2020   Generalized pruritus 01/16/2020   Acute upper respiratory infection 01/02/2020   Chlamydia 03/03/2017   Herpes genitalia 08/19/2016    Past Surgical History:  Procedure Laterality Date   Cesearen Secdtion N/A 01/05/2013    OB History     Gravida  3   Para  3   Term  2   Preterm  0   AB  0   Living  3      SAB  0   IAB  0   Ectopic  0   Multiple  0   Live Births  3            Home Medications    Prior to Admission medications   Medication Sig Start Date End Date Taking? Authorizing Provider   fluticasone (FLONASE) 50 MCG/ACT nasal spray Place 1 spray into both nostrils daily. 02/05/22  Yes Melynda Ripple, NP  oseltamivir (TAMIFLU) 75 MG capsule Take 1 capsule (75 mg total) by mouth every 12 (twelve) hours. 02/05/22  Yes Melynda Ripple, NP  cyclobenzaprine (FLEXERIL) 10 MG tablet Take 1 tablet (10 mg total) by mouth at bedtime. 03/26/20   Lavera Guise, MD  DOK 100 MG capsule TAKE 1 CAPSULE(100 MG) BY MOUTH TWICE DAILY 05/26/19   Kendell Bane, NP  famotidine (PEPCID) 20 MG tablet TAKE 1 TABLET(20 MG) BY MOUTH TWICE DAILY 05/26/19   Kendell Bane, NP  meloxicam (MOBIC) 15 MG tablet Take 1 tablet (15 mg total) by mouth daily. 03/26/20   Lavera Guise, MD  valACYclovir (VALTREX) 1000 MG tablet TAKE 1 TABLET(1000 MG) BY MOUTH DAILY 12/06/19   Luiz Ochoa, NP  Vitamin D, Ergocalciferol, (DRISDOL) 1.25 MG (50000 UNIT) CAPS capsule TAKE 1 CAPSULE BY MOUTH 1 TIME A WEEK 06/10/19   Lavera Guise, MD    Family History Family History  Problem Relation Age of Onset   Lung cancer Maternal Grandmother    Cancer Maternal Grandmother    Lung cancer Maternal Grandfather  Cancer Maternal Grandfather    Cancer Paternal Grandfather    Cancer Paternal Grandmother    Diabetes Mother    Breast cancer Neg Hx     Social History Social History   Tobacco Use   Smoking status: Former    Packs/day: 1.00    Types: Cigars, Cigarettes   Smokeless tobacco: Never   Tobacco comments:    doesn't want medications to help stop smoking  Vaping Use   Vaping Use: Former  Substance Use Topics   Alcohol use: Yes    Comment: occ   Drug use: Yes    Types: Marijuana     Allergies   Doxycycline   Review of Systems Review of Systems  HENT:  Positive for congestion, ear pain and sore throat.   Eyes:  Positive for redness.  Respiratory:  Positive for cough.   Neurological:  Positive for headaches.     Physical Exam Triage Vital Signs ED Triage Vitals  Enc Vitals Group     BP 02/05/22 1130  120/82     Pulse Rate 02/05/22 1130 (!) 107     Resp 02/05/22 1130 18     Temp 02/05/22 1130 99.9 F (37.7 C)     Temp Source 02/05/22 1130 Oral     SpO2 02/05/22 1130 97 %     Weight --      Height --      Head Circumference --      Peak Flow --      Pain Score 02/05/22 1133 5     Pain Loc --      Pain Edu? --      Excl. in [place]? --    No data found.  Updated Vital Signs BP 120/82 (BP Location: Right Arm)   Pulse (!) 107   Temp 99.9 F (37.7 C) (Oral)   Resp 18   LMP 02/03/2022   SpO2 97%   Visual Acuity Right Eye Distance:   Left Eye Distance:   Bilateral Distance:    Right Eye Near:   Left Eye Near:    Bilateral Near:     Physical Exam Vitals and nursing note reviewed.  Constitutional:      General: She is not in acute distress.    Appearance: She is well-developed. She is not ill-appearing.  HENT:     Head: Normocephalic and atraumatic.     Right Ear: Tympanic membrane and ear canal normal.     Left Ear: Tympanic membrane and ear canal normal.     Nose: Congestion present.     Mouth/Throat:     Mouth: Mucous membranes are moist.     Pharynx: Oropharynx is clear. Uvula midline. Posterior oropharyngeal erythema present.     Tonsils: No tonsillar exudate or tonsillar abscesses.  Eyes:     General:        Right eye: No discharge.        Left eye: No discharge.     Extraocular Movements: Extraocular movements intact.     Conjunctiva/sclera: Conjunctivae normal.     Pupils: Pupils are equal, round, and reactive to light.  Cardiovascular:     Rate and Rhythm: Normal rate and regular rhythm.     Heart sounds: Normal heart sounds.  Pulmonary:     Effort: Pulmonary effort is normal.     Breath sounds: Normal breath sounds.  Musculoskeletal:     Cervical back: Normal range of motion and neck supple.  Lymphadenopathy:     Cervical:  No cervical adenopathy.  Skin:    General: Skin is warm and dry.  Neurological:     General: No focal deficit present.      Mental Status: She is alert and oriented to person, place, and time.  Psychiatric:        Mood and Affect: Mood normal.        Behavior: Behavior normal.      UC Treatments / Results  Labs (all labs ordered are listed, but only abnormal results are displayed) Labs Reviewed  SARS CORONAVIRUS 2 (TAT 6-24 HRS)    EKG   Radiology No results found.  Procedures Procedures (including critical care time)  Medications Ordered in UC Medications - No data to display  Initial Impression / Assessment and Plan / UC Course  I have reviewed the triage vital signs and the nursing notes.  Pertinent labs & imaging results that were available during my care of the patient were reviewed by me and considered in my medical decision making (see chart for details).     Reviewed exam and symptoms with patient.  No red flags on exam. Discussed flulike symptoms, start Tamiflu COVID PCR and will contact if positive Flonase daily Patient prefers OTC cough medicine and will use as needed Rest and fluids PCP follow-up in 2 days for recheck ER precautions reviewed and patient verbalized understanding Final Clinical Impressions(s) / UC Diagnoses   Final diagnoses:  Acute cough  Flu-like symptoms  Viral illness     Discharge Instructions      Start Tamiflu twice daily for 5 days Flonase daily Over-the-counter cough medicine as needed Rest and fluids Please follow-up with your PCP in 2 to 3 days for recheck Please go to emergency room if you have any worsening symptoms   ED Prescriptions     Medication Sig Dispense Auth. Provider   oseltamivir (TAMIFLU) 75 MG capsule Take 1 capsule (75 mg total) by mouth every 12 (twelve) hours. 10 capsule Melynda Ripple, NP   fluticasone (FLONASE) 50 MCG/ACT nasal spray Place 1 spray into both nostrils daily. 15.8 mL Melynda Ripple, NP      PDMP not reviewed this encounter.   Melynda Ripple, NP 02/05/22 [phone]

## 2022-02-05 NOTE — ED Triage Notes (Signed)
Pt c/o headache, congestion, sneezing , dizziness, bilateral eye crusting this morning and scratchy throat.   Started: yesterday   Home interventions: Nyquil

## 2022-02-06 LAB — SARS CORONAVIRUS 2 (TAT 6-24 HRS): SARS Coronavirus 2: NEGATIVE

## 2022-05-02 ENCOUNTER — Encounter: Payer: Self-pay | Admitting: Internal Medicine

## 2022-06-27 ENCOUNTER — Ambulatory Visit: Payer: Medicaid Other | Admitting: Family Medicine

## 2022-07-15 ENCOUNTER — Ambulatory Visit: Payer: Medicaid Other | Admitting: Internal Medicine

## 2022-07-15 ENCOUNTER — Encounter: Payer: Self-pay | Admitting: Internal Medicine

## 2022-07-15 VITALS — BP 118/68 | HR 67 | Ht 63.0 in | Wt 208.4 lb

## 2022-07-15 DIAGNOSIS — K5900 Constipation, unspecified: Secondary | ICD-10-CM | POA: Diagnosis not present

## 2022-07-15 DIAGNOSIS — K6289 Other specified diseases of anus and rectum: Secondary | ICD-10-CM | POA: Diagnosis not present

## 2022-07-15 DIAGNOSIS — D509 Iron deficiency anemia, unspecified: Secondary | ICD-10-CM | POA: Diagnosis not present

## 2022-07-15 DIAGNOSIS — K625 Hemorrhage of anus and rectum: Secondary | ICD-10-CM

## 2022-07-15 DIAGNOSIS — R194 Change in bowel habit: Secondary | ICD-10-CM

## 2022-07-15 MED ORDER — PLENVU 140 G PO SOLR: 1.0000 | Freq: Once | ORAL | 0 refills | Status: AC

## 2022-07-15 NOTE — Progress Notes (Signed)
HISTORY OF PRESENT ILLNESS:  Grace [name] is a 37 y.o. 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  The patient reports that she developed problems with "hemorrhoids" approximately 14 years ago with the birth of one of her children.  Since that time she will experience intermittent problems with rectal discomfort and bleeding.  About 3 months ago she experienced the same.  She was noticing blood in the stool as well as on the toilet tissue.  It was associated rectal discomfort.  She also reported abdominal cramping and constipation.  Her bowel habits may be regular, though she can go up to 3 days without a bowel movement.  She will take Colace and Ex-Lax on demand.  No family history of colon cancer.  No prior GI evaluations.  She has had some weight gain.  Occasional reflux symptoms and bloating.  No dysphagia.  Outside records reviewed.  Hematocrit from December 2023 was 38.3, hemoglobin 11.7, MCV 81.7.  REVIEW OF SYSTEMS:  All non-GI ROS negative unless otherwise stated in the HPI except for anxiety, depression, skin rash, sleeping problems  Past Medical History:  Diagnosis Date   Anemia    BV (bacterial vaginosis)    Genital herpes    GERD (gastroesophageal reflux disease)    Gonorrhea    Hemorrhoids    Herpes    Hyperlipidemia    Obesity    Trichomoniasis     Past Surgical History:  Procedure Laterality Date   Cesearen Secdtion N/A 01/05/2013    Social History KIMESHA BIRCHETT  reports that she has quit smoking. Her smoking use included cigars and cigarettes. She smoked an average of 1 pack per day. She has never used smokeless tobacco. She reports current alcohol use. She reports current drug use. Drug: Marijuana.  family history includes Breast cancer in her mother; Cancer in her maternal grandfather, maternal grandmother, paternal  grandfather, and paternal grandmother; Diabetes in her mother; Lung cancer in her maternal grandfather and maternal grandmother.  Allergies  Allergen Reactions   Doxycycline     Nausea, stomach pain, diarrhea       PHYSICAL EXAMINATION: Vital signs: BP 118/68 (BP Location: Left Arm, Patient Position: Sitting)   Pulse 67   Ht 5\' 3"  (1.6 m)   Wt 208 lb 6 oz (94.5 kg)   SpO2 100%   BMI 36.91 kg/m   Constitutional: generally well-appearing, no acute distress Psychiatric: alert and oriented x3, cooperative Eyes: extraocular movements intact, anicteric, conjunctiva pink Mouth: oral pharynx moist, no lesions Neck: supple no lymphadenopathy Cardiovascular: heart regular rate and rhythm, no murmur Lungs: clear to auscultation bilaterally Abdomen: soft, nontender, nondistended, no obvious ascites, no peritoneal signs, normal bowel sounds, no organomegaly Rectal: Deferred to colonoscopy Extremities: no clubbing, cyanosis, or lower extremity edema bilaterally Skin: no lesions on visible extremities Neuro: No focal deficits.  Cranial nerves intact  ASSESSMENT:  1.  History of rectal bleeding as described. 2.  Rectal pain associated with bleeding.  Rule out hemorrhoids.  Rule out fissure. 3.  Change in bowel habits with constipation. 4.  Mild microcytic anemia   PLAN:  1.  Told to take Colace daily for constipation.  Use Ex-Lax on demand if needed 2.  Schedule colonoscopy to evaluate rectal bleeding, change in bowel habits, mild microcytic anemia.The nature of the procedure, as well as the risks, benefits, and alternatives were carefully and  thoroughly reviewed with the patient. Ample time for discussion and questions allowed. The patient understood, was satisfied, and agreed to proceed. 3.  Further recommendations after the above A total time of 60 minutes was spent preparing to see the patient, reviewing multiple outside records, obtaining comprehensive history, performing medically  appropriate physical examination, directing therapies for constipation, ordering and scheduling endoscopic procedure, counseling and educating the patient regarding the above listed issues, and documenting clinical information in the health record

## 2022-07-15 NOTE — Patient Instructions (Signed)
You have been scheduled for a colonoscopy. Please follow written instructions given to you at your visit today.   Please pick up your prep supplies at the pharmacy within the next 1-3 days.  If you use inhalers (even only as needed), please bring them with you on the day of your procedure.  DO NOT TAKE 7 DAYS PRIOR TO TEST- Trulicity (dulaglutide) Ozempic, Wegovy (semaglutide) Mounjaro (tirzepatide) Bydureon Bcise (exanatide extended release)  DO NOT TAKE 1 DAY PRIOR TO YOUR TEST Rybelsus (semaglutide) Adlyxin (lixisenatide) Victoza (liraglutide) Byetta (exanatide) ___________________________________________________________________________  _______________________________________________________  If your blood pressure at your visit was 140/90 or greater, please contact your primary care physician to follow up on this.  _______________________________________________________  If you are age 5 or older, your body mass index should be between 23-30. Your Body mass index is 36.91 kg/m. If this is out of the aforementioned range listed, please consider follow up with your Primary Care Provider.  If you are age 72 or younger, your body mass index should be between 19-25. Your Body mass index is 36.91 kg/m. If this is out of the aformentioned range listed, please consider follow up with your Primary Care Provider.   ________________________________________________________  The [facility] providers would like to encourage you to use [facility] to communicate with providers for non-urgent requests or questions.  Due to long hold times on the telephone, sending your provider a message by [facility] may be a faster and more efficient way to get a response.  Please allow 48 business hours for a response.  Please remember that this is for non-urgent requests.  _______________________________________________________

## 2022-07-15 NOTE — Addendum Note (Signed)
Addended by: Illene Bolus on: 07/15/2022 01:24 PM   Modules accepted: Orders

## 2022-07-21 ENCOUNTER — Encounter: Payer: Self-pay | Admitting: Internal Medicine

## 2022-07-21 ENCOUNTER — Ambulatory Visit (AMBULATORY_SURGERY_CENTER): Payer: Medicaid Other | Admitting: Internal Medicine

## 2022-07-21 VITALS — BP 102/64 | HR 67 | Temp 97.8°F | Resp 13 | Ht 63.0 in | Wt 208.0 lb

## 2022-07-21 DIAGNOSIS — K625 Hemorrhage of anus and rectum: Secondary | ICD-10-CM

## 2022-07-21 DIAGNOSIS — D125 Benign neoplasm of sigmoid colon: Secondary | ICD-10-CM

## 2022-07-21 DIAGNOSIS — K5900 Constipation, unspecified: Secondary | ICD-10-CM

## 2022-07-21 DIAGNOSIS — D509 Iron deficiency anemia, unspecified: Secondary | ICD-10-CM

## 2022-07-21 DIAGNOSIS — R194 Change in bowel habit: Secondary | ICD-10-CM

## 2022-07-21 DIAGNOSIS — K635 Polyp of colon: Secondary | ICD-10-CM | POA: Diagnosis not present

## 2022-07-21 MED ORDER — SODIUM CHLORIDE 0.9 % IV SOLN: 500.0000 mL | Freq: Once | INTRAVENOUS | Status: DC

## 2022-07-21 NOTE — Progress Notes (Signed)
 Pt's states no medical or surgical changes since previsit or office visit. 

## 2022-07-21 NOTE — Op Note (Signed)
[facility] Patient Name: Grace [name] Procedure Date: 07/21/2022 10:59 AM MRN: [id number] Endoscopist: Wilhemina Bonito. Marina Goodell , MD, [id number] Age: 37 Referring MD:  Date of Birth: 14-Oct-1985 Gender: Female Account #: [id number] Procedure:                Colonoscopy with cold snare polypectomy x 1 Indications:              Rectal bleeding, microcytic anemia, Change in bowel                            habits Medicines:                Monitored Anesthesia Care Procedure:                Pre-Anesthesia Assessment:                           - Prior to the procedure, a History and Physical                            was performed, and patient medications and                            allergies were reviewed. The patient's tolerance of                            previous anesthesia was also reviewed. The risks                            and benefits of the procedure and the sedation                            options and risks were discussed with the patient.                            All questions were answered, and informed consent                            was obtained. Prior Anticoagulants: The patient has                            taken no anticoagulant or antiplatelet agents. ASA                            Grade Assessment: II - A patient with mild systemic                            disease. After reviewing the risks and benefits,                            the patient was deemed in satisfactory condition to                            undergo the procedure.  After obtaining informed consent, the colonoscope                            was passed under direct vision. Throughout the                            procedure, the patient's blood pressure, pulse, and                            oxygen saturations were monitored continuously. The                            Olympus Scope SN: [id number] was introduced through                            the anus and  advanced to the the cecum, identified                            by appendiceal orifice and ileocecal valve. The                            ileocecal valve, appendiceal orifice, and rectum                            were photographed. The quality of the bowel                            preparation was excellent. The colonoscopy was                            performed without difficulty. The patient tolerated                            the procedure well. The bowel preparation used was                            SUPREP via split dose instruction. Scope In: 11:10:37 AM Scope Out: 11:20:34 AM Scope Withdrawal Time: 0 hours 8 minutes 1 second  Total Procedure Duration: 0 hours 9 minutes 57 seconds  Findings:                 A 3 mm polyp was found in the sigmoid colon. The                            polyp was removed with a cold snare. Resection and                            retrieval were complete.                           Internal hemorrhoids were found during                            retroflexion. The hemorrhoids were small.  The exam was otherwise without abnormality on                            direct and retroflexion views. Complications:            No immediate complications. Estimated blood loss:                            None. Estimated Blood Loss:     Estimated blood loss: none. Impression:               - One 3 mm polyp in the sigmoid colon, removed with                            a cold snare. Resected and retrieved.                           - Internal hemorrhoids.                           - The examination was otherwise normal on direct                            and retroflexion views. Recommendation:           - Repeat colonoscopy in 10 years for surveillance.                           - Patient has a contact number available for                            emergencies. The signs and symptoms of potential                            delayed  complications were discussed with the                            patient. Return to normal activities tomorrow.                            Written discharge instructions were provided to the                            patient.                           - Resume previous diet.                           - Continue present medications. Take daily stool                            softener                           - Await pathology results. Wilhemina Bonito. Marina Goodell, MD 07/21/2022 11:26:33 AM This report has been signed electronically.

## 2022-07-21 NOTE — Progress Notes (Signed)
 Called to room to assist during endoscopic procedure.  Patient ID and intended procedure confirmed with present staff. Received instructions for my participation in the procedure from the performing physician.  

## 2022-07-21 NOTE — Patient Instructions (Signed)
 Discharge instructions given. Handouts on polyps and Hemorrhoids. Resume previous medications. YOU HAD AN ENDOSCOPIC PROCEDURE TODAY AT THE [facility]:   Refer to the procedure report that was given to you for any specific questions about what was found during the examination.  If the procedure report does not answer your questions, please call your gastroenterologist to clarify.  If you requested that your care partner not be given the details of your procedure findings, then the procedure report has been included in a sealed envelope for you to review at your convenience later.  YOU SHOULD EXPECT: Some feelings of bloating in the abdomen. Passage of more gas than usual.  Walking can help get rid of the air that was put into your GI tract during the procedure and reduce the bloating. If you had a lower endoscopy (such as a colonoscopy or flexible sigmoidoscopy) you may notice spotting of blood in your stool or on the toilet paper. If you underwent a bowel prep for your procedure, you may not have a normal bowel movement for a few days.  Please Note:  You might notice some irritation and congestion in your nose or some drainage.  This is from the oxygen used during your procedure.  There is no need for concern and it should clear up in a day or so.  SYMPTOMS TO REPORT IMMEDIATELY:  Following lower endoscopy (colonoscopy or flexible sigmoidoscopy):  Excessive amounts of blood in the stool  Significant tenderness or worsening of abdominal pains  Swelling of the abdomen that is new, acute  Fever of 100F or higher   For urgent or emergent issues, a gastroenterologist can be reached at any hour by calling [phone]. Do not use MyChart messaging for urgent concerns.    DIET:  We do recommend a small meal at first, but then you may proceed to your regular diet.  Drink plenty of fluids but you should avoid alcoholic beverages for 24 hours.  ACTIVITY:  You should plan to take it  easy for the rest of today and you should NOT DRIVE or use heavy machinery until tomorrow (because of the sedation medicines used during the test).    FOLLOW UP: Our staff will call the number listed on your records the next business day following your procedure.  We will call around 7:15- 8:00 am to check on you and address any questions or concerns that you may have regarding the information given to you following your procedure. If we do not reach you, we will leave a message.     If any biopsies were taken you will be contacted by phone or by letter within the next 1-3 weeks.  Please call us at [phone] if you have not heard about the biopsies in 3 weeks.    SIGNATURES/CONFIDENTIALITY: You and/or your care partner have signed paperwork which will be entered into your electronic medical record.  These signatures attest to the fact that that the information above on your After Visit Summary has been reviewed and is understood.  Full responsibility of the confidentiality of this discharge information lies with you and/or your care-partner. 

## 2022-07-21 NOTE — Progress Notes (Signed)
HISTORY OF PRESENT ILLNESS:   Grace [name] is a 37 y.o. 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   The patient reports that she developed problems with "hemorrhoids" approximately 14 years ago with the birth of one of her children.  Since that time she will experience intermittent problems with rectal discomfort and bleeding.  About 3 months ago she experienced the same.  She was noticing blood in the stool as well as on the toilet tissue.  It was associated rectal discomfort.  She also reported abdominal cramping and constipation.  Her bowel habits may be regular, though she can go up to 3 days without a bowel movement.  She will take Colace and Ex-Lax on demand.  No family history of colon cancer.  No prior GI evaluations.  She has had some weight gain.  Occasional reflux symptoms and bloating.  No dysphagia.  Outside records reviewed.  Hematocrit from December 2023 was 38.3, hemoglobin 11.7, MCV 81.7.   REVIEW OF SYSTEMS:   All non-GI ROS negative unless otherwise stated in the HPI except for anxiety, depression, skin rash, sleeping problems       Past Medical History:  Diagnosis Date   Anemia     BV (bacterial vaginosis)     Genital herpes     GERD (gastroesophageal reflux disease)     Gonorrhea     Hemorrhoids     Herpes     Hyperlipidemia     Obesity     Trichomoniasis                 Past Surgical History:  Procedure Laterality Date   Cesearen Secdtion N/A 01/05/2013          Social History Grace [name]  reports that she has quit smoking. Her smoking use included cigars and cigarettes. She smoked an average of 1 pack per day. She has never used smokeless tobacco. She reports current alcohol use. She reports current drug use. Drug: Marijuana.   family history includes Breast cancer in her mother; Cancer in her maternal  grandfather, maternal grandmother, paternal grandfather, and paternal grandmother; Diabetes in her mother; Lung cancer in her maternal grandfather and maternal grandmother.   Allergies       Allergies  Allergen Reactions   Doxycycline        Nausea, stomach pain, diarrhea            PHYSICAL EXAMINATION: Vital signs: BP 118/68 (BP Location: Left Arm, Patient Position: Sitting)   Pulse 67   Ht 5\' 3"  (1.6 m)   Wt 208 lb 6 oz (94.5 kg)   SpO2 100%   BMI 36.91 kg/m   Constitutional: generally well-appearing, no acute distress Psychiatric: alert and oriented x3, cooperative Eyes: extraocular movements intact, anicteric, conjunctiva pink Mouth: oral pharynx moist, no lesions Neck: supple no lymphadenopathy Cardiovascular: heart regular rate and rhythm, no murmur Lungs: clear to auscultation bilaterally Abdomen: soft, nontender, nondistended, no obvious ascites, no peritoneal signs, normal bowel sounds, no organomegaly Rectal: Deferred to colonoscopy Extremities: no clubbing, cyanosis, or lower extremity edema bilaterally Skin: no lesions on visible extremities Neuro: No focal deficits.  Cranial nerves intact   ASSESSMENT:   1.  History of rectal bleeding as described. 2.  Rectal pain associated with bleeding.  Rule out hemorrhoids.  Rule out fissure. 3.  Change in bowel habits with constipation.  4.  Mild microcytic anemia     PLAN:   1.  Told to take Colace daily for constipation.  Use Ex-Lax on demand if needed 2.  Schedule colonoscopy to evaluate rectal bleeding, change in bowel habits, mild microcytic anemia.The nature of the procedure, as well as the risks, benefits, and alternatives were carefully and thoroughly reviewed with the patient. Ample time for discussion and questions allowed. The patient understood, was satisfied, and agreed to proceed. 3.  Further recommendations after the above

## 2022-07-22 ENCOUNTER — Telehealth: Payer: Self-pay

## 2022-07-22 NOTE — Telephone Encounter (Signed)
  Follow up Call-     07/21/2022   10:13 AM  Call back number  Post procedure Call Back phone  # [phone]  Permission to leave phone message Yes     Patient questions:  Do you have a fever, pain , or abdominal swelling? No. Pain Score  0 *  Have you tolerated food without any problems? Yes.    Have you been able to return to your normal activities? Yes.    Do you have any questions about your discharge instructions: Diet   No. Medications  No. Follow up visit  No.  Do you have questions or concerns about your Care? No.  Actions: * If pain score is 4 or above: No action needed, pain <4.

## 2022-07-24 ENCOUNTER — Encounter: Payer: Self-pay | Admitting: Internal Medicine

## 2022-08-04 ENCOUNTER — Encounter (HOSPITAL_COMMUNITY): Payer: Self-pay

## 2022-08-04 ENCOUNTER — Ambulatory Visit (HOSPITAL_COMMUNITY)
Admission: RE | Admit: 2022-08-04 | Discharge: 2022-08-04 | Disposition: A | Discharge status: Home / Self Care | Payer: Medicaid Other | Source: Ambulatory Visit | Attending: Family Medicine | Admitting: Family Medicine

## 2022-08-04 VITALS — BP 117/77 | HR 87 | Temp 98.6°F | Resp 16

## 2022-08-04 DIAGNOSIS — L509 Urticaria, unspecified: Secondary | ICD-10-CM | POA: Diagnosis present

## 2022-08-04 DIAGNOSIS — N898 Other specified noninflammatory disorders of vagina: Secondary | ICD-10-CM | POA: Diagnosis present

## 2022-08-04 LAB — POCT URINE PREGNANCY: Preg Test, Ur: NEGATIVE

## 2022-08-04 MED ORDER — FLUCONAZOLE 150 MG PO TABS: ORAL_TABLET | ORAL | 0 refills | Status: DC

## 2022-08-04 MED ORDER — PREDNISONE 20 MG PO TABS: 40.0000 mg | ORAL_TABLET | Freq: Every day | ORAL | 0 refills | Status: DC

## 2022-08-04 NOTE — Discharge Instructions (Signed)
We have sent testing for various causes of vaginal infections. We will notify you of any positive results once they are received. If required, we will prescribe any medications you might need.  Please refrain from all sexual activity for at least the next seven days.  

## 2022-08-04 NOTE — ED Triage Notes (Signed)
Pt c/o rash on bilateral arms for 3-4 days. Denies any new soaps, lotions, products, etc.   Pt reports that her menstrual cycle is late. LMP 6/22.   Pt reports that she has thicker vaginal discharge and had discomfort while having intercourse yesterday. Concerned for yeast infection.

## 2022-08-06 ENCOUNTER — Telehealth (HOSPITAL_COMMUNITY): Payer: Self-pay | Admitting: Emergency Medicine

## 2022-08-06 MED ORDER — METRONIDAZOLE 500 MG PO TABS: 500.0000 mg | ORAL_TABLET | Freq: Two times a day (BID) | ORAL | 0 refills | Status: DC

## 2022-08-06 NOTE — ED Provider Notes (Signed)
[facility]   [id number] 08/04/22 Arrival Time: 1321  ASSESSMENT & PLAN:  1. Urticaria   2. Vaginal irritation    Desires empiric treatment for yeast infection. Await cytology. Meds ordered this encounter  Medications   fluconazole (DIFLUCAN) 150 MG tablet    Sig: Take one tablet by mouth as a single dose. May repeat in 3 days if symptoms persist.    Dispense:  2 tablet    Refill:  0   predniSONE (DELTASONE) 20 MG tablet    Sig: Take 2 tablets (40 mg total) by mouth daily.    Dispense:  14 tablet    Refill:  0      Discharge Instructions      We have sent testing for various causes of vaginal infections. We will notify you of any positive results once they are received. If required, we will prescribe any medications you might need.  Please refrain from all sexual activity for at least the next seven days.     Without s/s of PID.  Vaginal cytology pending. Will notify of any positive results. Instructed to refrain from sexual activity for at least seven days.  Reviewed expectations re: course of current medical issues. Questions answered. Outlined signs and symptoms indicating need for more acute intervention. Patient verbalized understanding. After Visit Summary given.   SUBJECTIVE:  Grace [name] is a 37 y.o. female who presents with complaint of vaginal discharge. Pt reports that her menstrual cycle is late. LMP 6/22.  Pt reports that she has thicker vaginal discharge and had discomfort while having intercourse yesterday. Concerned for yeast infection.   Pt also c/o rash on bilateral arms for 3-4 days. Denies any new soaps, lotions, products, etc. Does itch a little.    OBJECTIVE:  Vitals:   08/04/22 1354  BP: 117/77  Pulse: 87  Resp: 16  Temp: 98.6 F (37 C)  TempSrc: Oral  SpO2: 98%     General appearance: alert, cooperative, appears stated age and no distress Lungs: unlabored respirations; speaks full sentences without  difficulty Back: no CVA tenderness; FROM at waist Abdomen: soft, non-tender GU: deferred Skin: warm and dry; faint and splotchy erythematous rash on bilateral forearms Psychological: alert and cooperative; normal mood and affect.  Results for orders placed or performed during the hospital encounter of 08/04/22  POCT urine pregnancy  Result Value Ref Range   Preg Test, Ur Negative Negative  Cervicovaginal ancillary only  Result Value Ref Range   Neisseria Gonorrhea Negative    Chlamydia Negative    Trichomonas Negative    Bacterial Vaginitis (gardnerella) Positive (A)    Candida Vaginitis Negative    Candida Glabrata Negative    Comment      Normal Reference Range Bacterial Vaginosis - Negative   Comment Normal Reference Range Candida Species - Negative    Comment Normal Reference Range Candida Galbrata - Negative    Comment Normal Reference Range Trichomonas - Negative    Comment Normal Reference Ranger Chlamydia - Negative    Comment      Normal Reference Range Neisseria Gonorrhea - Negative    Labs Reviewed  CERVICOVAGINAL ANCILLARY ONLY - Abnormal; Notable for the following components:      Result Value   Bacterial Vaginitis (gardnerella) Positive (*)    All other components within normal limits  POCT URINE PREGNANCY    Allergies  Allergen Reactions   Doxycycline Nausea And Vomiting, Nausea Only and Other (See Comments)    Nausea, stomach pain,  diarrhea    Past Medical History:  Diagnosis Date   Anemia    BV (bacterial vaginosis)    Genital herpes    GERD (gastroesophageal reflux disease)    Gonorrhea    Hemorrhoids    Herpes    Hyperlipidemia    Obesity    Trichomoniasis    Family History  Problem Relation Age of Onset   Diabetes Mother    Breast cancer Mother    Lung cancer Maternal Grandmother    Cancer Maternal Grandmother    Lung cancer Maternal Grandfather    Cancer Maternal Grandfather    Cancer Paternal Grandmother    Cancer Paternal  Grandfather    Colon cancer Neg Hx    Colon polyps Neg Hx    Esophageal cancer Neg Hx    Stomach cancer Neg Hx    Rectal cancer Neg Hx    Social History   Socioeconomic History   Marital status: Single    Spouse name: Not on file   Number of children: 3   Years of education: 12   Highest education level: Not on file  Occupational History   Not on file  Tobacco Use   Smoking status: Former    Current packs/day: 1.00    Types: Cigars, Cigarettes   Smokeless tobacco: Never   Tobacco comments:    doesn't want medications to help stop smoking  Vaping Use   Vaping status: Every Day   Substances: Nicotine  Substance and Sexual Activity   Alcohol use: Not Currently    Comment: none since 07/2021   Drug use: Not Currently    Types: Marijuana    Comment: none since 07/2021   Sexual activity: Yes    Birth control/protection: None    Comment: husband is sterile  Other Topics Concern   Not on file  Social History Narrative   Not on file   Social Determinants of Health   Financial Resource Strain: Medium Risk (07/12/2022)   Received from [facility], [facility]   Overall Financial Resource Strain (CARDIA)    Difficulty of Paying Living Expenses: Somewhat hard  Food Insecurity: Food Insecurity Present (07/12/2022)   Received from [facility], [facility]   Hunger Vital Sign    Worried About Running Out of Food in the Last Year: Sometimes true    Ran Out of Food in the Last Year: Sometimes true  Transportation Needs: No Transportation Needs (07/12/2022)   Received from [employer], [facility]   PRAPARE - Transportation    Lack of Transportation (Medical): No    Lack of Transportation (Non-Medical): No  Physical Activity: Unknown (07/12/2022)   Received from [facility], [facility]   Exercise Vital Sign    Days of Exercise per Week: 0 days    Minutes of Exercise per Session: Not on file  Stress: Stress Concern Present (07/12/2022)   Received from [facility],  [facility] of Occupational Health - Occupational Stress Questionnaire    Feeling of Stress : To some extent  Social Connections: Socially Integrated (07/12/2022)   Received from [facility], [facility]   Social Network    How would you rate your social network (family, work, friends)?: Good participation with social networks  Intimate Partner Violence: Not At Risk (07/12/2022)   Received from [facility], [facility]   HITS    Over the last 12 months how often did your partner physically hurt you?: 1    Over the last  12 months how often did your partner insult you or talk down to you?: 1    Over the last 12 months how often did your partner threaten you with physical harm?: 1    Over the last 12 months how often did your partner scream or curse at you?: 1           Mardella Layman, MD 08/06/22 1116

## 2022-11-23 ENCOUNTER — Ambulatory Visit (HOSPITAL_COMMUNITY)
Admission: EM | Admit: 2022-11-23 | Discharge: 2022-11-23 | Disposition: A | Discharge status: Home / Self Care | Payer: Medicaid Other | Attending: Emergency Medicine | Admitting: Emergency Medicine

## 2022-11-23 ENCOUNTER — Encounter (HOSPITAL_COMMUNITY): Payer: Self-pay | Admitting: Emergency Medicine

## 2022-11-23 DIAGNOSIS — J069 Acute upper respiratory infection, unspecified: Secondary | ICD-10-CM

## 2022-11-23 MED ORDER — GUAIFENESIN ER 600 MG PO TB12: 1200.0000 mg | ORAL_TABLET | Freq: Two times a day (BID) | ORAL | 0 refills | Status: AC

## 2022-11-23 MED ORDER — CETIRIZINE HCL 10 MG PO TABS: 10.0000 mg | ORAL_TABLET | Freq: Every day | ORAL | 2 refills | Status: AC

## 2022-11-23 NOTE — Discharge Instructions (Addendum)
Zyrtec daily with flonase nasal spray daily Mucinex (guaifenesin) 2 tablets twice daily with lots of fluids Allow several more days for symptoms to improve/resolve. Please return if needed!

## 2022-11-23 NOTE — ED Provider Notes (Signed)
[facility]    CSN: [id number] Arrival date & time: 11/23/22  1138     History   Chief Complaint Chief Complaint  Patient presents with   Nasal Congestion   Headache    HPI Grace [name] is a 37 y.o. female.  4-day history of runny nose, nasal congestion and drainage Some sneezing but no cough. Mild head pressure rated 1/10 Not having sore throat, fever, ear pain Has tried liquid Mucinex once, no other meds  No known sick contacts.  No recent travel  Past Medical History:  Diagnosis Date   Anemia    BV (bacterial vaginosis)    Genital herpes    GERD (gastroesophageal reflux disease)    Gonorrhea    Hemorrhoids    Herpes    Hyperlipidemia    Obesity    Trichomoniasis     Patient Active Problem List   Diagnosis Date Noted   Candida vaginitis 01/16/2020   Gastroesophageal reflux disease without esophagitis 01/16/2020   Generalized pruritus 01/16/2020   Acute upper respiratory infection 01/02/2020   Chlamydia 03/03/2017   Herpes genitalia 08/19/2016    Past Surgical History:  Procedure Laterality Date   Cesearen Secdtion N/A 01/05/2013    OB History     Gravida  3   Para  3   Term  2   Preterm  0   AB  0   Living  3      SAB  0   IAB  0   Ectopic  0   Multiple  0   Live Births  3            Home Medications    Prior to Admission medications   Medication Sig Start Date End Date Taking? Authorizing Provider  cetirizine (ZYRTEC ALLERGY) 10 MG tablet Take 1 tablet (10 mg total) by mouth daily. 11/23/22  Yes Braydee Shimkus, Lurena Joiner, PA-C  guaiFENesin (MUCINEX) 600 MG 12 hr tablet Take 2 tablets (1,200 mg total) by mouth 2 (two) times daily for 5 days. 11/23/22 11/28/22 Yes Alayjah Boehringer, Lurena Joiner, PA-C  BIOTIN 5000 PO Take by mouth.    [provider]  Cyanocobalamin (VITAMIN B 12 PO) Take by mouth.    [provider]  fluticasone (FLONASE) 50 MCG/ACT nasal spray Place 1 spray into both nostrils daily. 02/05/22    Radford Pax, NP  VITAMIN D, CHOLECALCIFEROL, PO Take by mouth.    [provider]    Family History Family History  Problem Relation Age of Onset   Diabetes Mother    Breast cancer Mother    Lung cancer Maternal Grandmother    Cancer Maternal Grandmother    Lung cancer Maternal Grandfather    Cancer Maternal Grandfather    Cancer Paternal Grandmother    Cancer Paternal Grandfather    Colon cancer Neg Hx    Colon polyps Neg Hx    Esophageal cancer Neg Hx    Stomach cancer Neg Hx    Rectal cancer Neg Hx     Social History Social History   Tobacco Use   Smoking status: Former    Current packs/day: 1.00    Types: Cigars, Cigarettes   Smokeless tobacco: Never   Tobacco comments:    doesn't want medications to help stop smoking  Vaping Use   Vaping status: Every Day   Substances: Nicotine  Substance Use Topics   Alcohol use: Not Currently    Comment: none since 07/2021   Drug use: Not Currently  Types: Marijuana    Comment: none since 07/2021     Allergies   Doxycycline   Review of Systems Review of Systems As per HPI  Physical Exam Triage Vital Signs ED Triage Vitals  Encounter Vitals Group     BP 11/23/22 1309 102/69     Systolic BP Percentile --      Diastolic BP Percentile --      Pulse Rate 11/23/22 1309 90     Resp 11/23/22 1309 18     Temp 11/23/22 1309 98.4 F (36.9 C)     Temp Source 11/23/22 1309 Oral     SpO2 11/23/22 1309 96 %     Weight --      Height --      Head Circumference --      Peak Flow --      Pain Score 11/23/22 1313 1     Pain Loc --      Pain Education --      Exclude from Growth Chart --    No data found.  Updated Vital Signs BP 102/69 (BP Location: Left Arm)   Pulse 90   Temp 98.4 F (36.9 C) (Oral)   Resp 18   LMP 11/05/2022 (Approximate)   SpO2 96%    Physical Exam Vitals and nursing note reviewed.  Constitutional:      General: She is not in acute distress.    Appearance: She is not  ill-appearing.  HENT:     Right Ear: Tympanic membrane and ear canal normal.     Left Ear: Tympanic membrane and ear canal normal.     Nose: Congestion and rhinorrhea present.     Mouth/Throat:     Mouth: Mucous membranes are moist.     Pharynx: Oropharynx is clear. No posterior oropharyngeal erythema.  Eyes:     Conjunctiva/sclera: Conjunctivae normal.  Cardiovascular:     Rate and Rhythm: Normal rate and regular rhythm.     Pulses: Normal pulses.     Heart sounds: Normal heart sounds.  Pulmonary:     Effort: Pulmonary effort is normal.     Breath sounds: Normal breath sounds.  Musculoskeletal:     Cervical back: Normal range of motion.  Lymphadenopathy:     Cervical: No cervical adenopathy.  Skin:    General: Skin is warm and dry.  Neurological:     Mental Status: She is alert and oriented to person, place, and time.     UC Treatments / Results  Labs (all labs ordered are listed, but only abnormal results are displayed) Labs Reviewed - No data to display  EKG  Radiology No results found.  Procedures Procedures   Medications Ordered in UC Medications - No data to display  Initial Impression / Assessment and Plan / UC Course  I have reviewed the triage vital signs and the nursing notes.  Pertinent labs & imaging results that were available during my care of the patient were reviewed by me and considered in my medical decision making (see chart for details).  Afebrile, well appearing Likely viral etiology, recommend symptomatic care. Discussed OTC medications. She has flonase at home I recommend to start in addition to zyrtec, try guaifenesin BID with increase fluids. Advised prognosis and timeline of viral illness, can return if needed. Patient is agreeable to plan, no questions at this time  Final Clinical Impressions(s) / UC Diagnoses   Final diagnoses:  Viral URI     Discharge Instructions  Zyrtec daily with flonase nasal spray daily Mucinex  (guaifenesin) 2 tablets twice daily with lots of fluids Allow several more days for symptoms to improve/resolve. Please return if needed!    ED Prescriptions     Medication Sig Dispense Auth. Provider   guaiFENesin (MUCINEX) 600 MG 12 hr tablet Take 2 tablets (1,200 mg total) by mouth 2 (two) times daily for 5 days. 20 tablet Aldora Perman, PA-C   cetirizine (ZYRTEC ALLERGY) 10 MG tablet Take 1 tablet (10 mg total) by mouth daily. 30 tablet Jahaad Penado, Lurena Joiner, PA-C      PDMP not reviewed this encounter.   Marlow Baars, [place] 11/23/22 1432

## 2022-11-23 NOTE — ED Triage Notes (Signed)
Pt c/o congestion, runny nose, sinus pressure, and sneezing for 4 days.

## 2023-02-17 ENCOUNTER — Ambulatory Visit (HOSPITAL_COMMUNITY): Payer: Medicaid Other
